# Patient Record
Sex: Male | Born: 1985 | Race: Black or African American | Hispanic: No | Marital: Single | Smoking: Former smoker
Health system: Southern US, Community
[De-identification: ages and names within clinical notes are randomized; demographics above are authoritative.]

## PROBLEM LIST (undated history)

## (undated) HISTORY — PX: NO PAST SURGERIES: SHX2092

---
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
## 2011-08-25 ENCOUNTER — Emergency Department (INDEPENDENT_AMBULATORY_CARE_PROVIDER_SITE_OTHER)
Admission: EM | Admit: 2011-08-25 | Discharge: 2011-08-25 | Disposition: A | Discharge status: Home / Self Care | Payer: Medicare Other | Source: Home / Self Care | Attending: Emergency Medicine | Admitting: Emergency Medicine

## 2011-08-25 ENCOUNTER — Encounter (HOSPITAL_COMMUNITY): Payer: Self-pay | Admitting: *Deleted

## 2011-08-25 DIAGNOSIS — R05 Cough: Secondary | ICD-10-CM

## 2011-08-25 MED ORDER — GI COCKTAIL ~~LOC~~
ORAL | Status: AC
  Filled 2011-08-25: qty 30

## 2011-08-25 MED ORDER — GI COCKTAIL ~~LOC~~
30.0000 mL | Freq: Once | ORAL | Status: AC
  Administered 2011-08-25: 30 mL via ORAL

## 2011-08-25 MED ORDER — FAMOTIDINE 20 MG PO TABS: 20.0000 mg | ORAL_TABLET | Freq: Every day | ORAL | Status: DC

## 2011-08-25 MED ORDER — PANTOPRAZOLE SODIUM 40 MG PO TBEC: 40.0000 mg | DELAYED_RELEASE_TABLET | Freq: Every day | ORAL | Status: DC

## 2011-08-25 NOTE — Discharge Instructions (Signed)
Try this first. Return or follow up with a doctor of your choice if you do not get better in a week with these medications. Return sooner if you have a fever >100.4, if you have trouble breathing, or any other concerns.   Go to www.goodrx.com to look up your medications. This will give you a list of where you can find your prescriptions at the most affordable prices.   RESOURCE GUIDE   Insufficient Money for Medicine Contact [employer]:  call "211" or [facility] [phone].  No Primary Care Doctor Call [facility]  [phone] Other agencies that provide inexpensive medical care    [facility]  [phone]    [facility]  [phone]    [facility]  [phone]    [facility]  [phone] [address] [place] [address] [facility] [phone]   [address], [place]z [address] [place], [place] [address]  [facility] Resources  [facility]     [employer]                          [facility]. [address] [place]                       [address]      [address] [place] [address]   [phone] (After Hours)

## 2011-08-25 NOTE — ED Notes (Signed)
Pt  Has  Symptoms  Of  Cough   /  Body  Aches  As well  As      Congested  And  Feeling  Weak  Pt  Reports  Symptoms   X  1  Week              Pt  Awake  As  Well  As  Alert       Speaking in complete  sentances        maske  And  In a  Private  Room

## 2011-08-25 NOTE — ED Provider Notes (Signed)
History     CSN: [id number]  Arrival date & time 08/25/11  1327   First MD Initiated Contact with Patient 08/25/11 1423      Chief Complaint  Patient presents with  . Cough    (Consider location/radiation/quality/duration/timing/severity/associated sxs/prior treatment) HPI Comments: Patient with nonproductive cough, irritated, itchy throat, waterbrash for the past week. Symptoms are worse at night when he lies down, and in the morning. Able to sleep through the night. No wheezing, chest pain, shortness of breath. No nausea, vomiting, fevers. No abdominal pain. Patient reports having a URI preceding his cough, with nasal congestion, rhinorrhea, postnasal drip, but that this has improved.  ROS as noted in HPI. All other ROS negative.   Patient is a 26 y.o. male presenting with cough. The history is provided by the patient. No language interpreter was used.  Cough This is a new problem. The current episode started more than 2 days ago. The problem has not changed since onset.The cough is non-productive. There has been no fever. Pertinent negatives include no shortness of breath.    History reviewed. No pertinent past medical history.  History reviewed. No pertinent past surgical history.  History reviewed. No pertinent family history.  History  Substance Use Topics  . Smoking status: Never Smoker   . Smokeless tobacco: Not on file  . Alcohol Use: No      Review of Systems  Respiratory: Positive for cough. Negative for shortness of breath.     Allergies  Review of patient's allergies indicates no known allergies.  Home Medications   Current Outpatient Rx  Name Route Sig Dispense Refill  . FAMOTIDINE 20 MG PO TABS Oral Take 1 tablet (20 mg total) by mouth daily. 20 tablet 0  . PANTOPRAZOLE SODIUM 40 MG PO TBEC Oral Take 1 tablet (40 mg total) by mouth daily. 20 tablet 0    BP 131/78  Pulse 100  Temp(Src) 99.3 F (37.4 C) (Oral)  Resp 18  SpO2 100%  Physical  Exam  Nursing note and vitals reviewed. Constitutional: He is oriented to person, place, and time. He appears well-developed and well-nourished.  HENT:  Head: Normocephalic and atraumatic.  Nose: Nose normal.  Mouth/Throat: Uvula is midline, oropharynx is clear and moist and mucous membranes are normal.  Eyes: Conjunctivae and EOM are normal.  Neck: Normal range of motion.  Cardiovascular: Normal rate, regular rhythm, normal heart sounds and intact distal pulses.   Pulmonary/Chest: Effort normal and breath sounds normal. No respiratory distress.  Abdominal: Soft. Bowel sounds are normal. He exhibits no distension.  Musculoskeletal: Normal range of motion.  Lymphadenopathy:    He has no cervical adenopathy.  Neurological: He is alert and oriented to person, place, and time.  Skin: Skin is warm and dry.  Psychiatric: He has a normal mood and affect. His behavior is normal.    ED Course  Procedures (including critical care time)  Labs Reviewed - No data to display No results found.   1. Cough       MDM  H&P suggestive of reflux. Lungs clear. No evidence of pneumonia, asthma at this time. Could also be a post viral cough, as patient does report having a upper respiratory infection preceding his cough. Gave GI cocktail, with significant improvement in symptoms. Will send him home with a PPI and H2 blocker. We'll refer him to local resources. If I since return if symptoms get worse, if he does not improve with current treatment, her other concerns.  Morrie Sheldon  Chaney Malling, MD 08/25/11 1815

## 2011-09-09 ENCOUNTER — Emergency Department (HOSPITAL_COMMUNITY)
Admission: EM | Admit: 2011-09-09 | Discharge: 2011-09-09 | Disposition: A | Discharge status: Home / Self Care | Payer: Medicare Other | Attending: Emergency Medicine | Admitting: Emergency Medicine

## 2011-09-09 ENCOUNTER — Encounter (HOSPITAL_COMMUNITY): Payer: Self-pay | Admitting: *Deleted

## 2011-09-09 ENCOUNTER — Emergency Department (HOSPITAL_COMMUNITY): Payer: Medicare Other

## 2011-09-09 DIAGNOSIS — K219 Gastro-esophageal reflux disease without esophagitis: Secondary | ICD-10-CM | POA: Insufficient documentation

## 2011-09-09 DIAGNOSIS — R05 Cough: Secondary | ICD-10-CM | POA: Insufficient documentation

## 2011-09-09 DIAGNOSIS — R059 Cough, unspecified: Secondary | ICD-10-CM | POA: Insufficient documentation

## 2011-09-09 DIAGNOSIS — R079 Chest pain, unspecified: Secondary | ICD-10-CM | POA: Insufficient documentation

## 2011-09-09 DIAGNOSIS — R110 Nausea: Secondary | ICD-10-CM | POA: Insufficient documentation

## 2011-09-09 DIAGNOSIS — R109 Unspecified abdominal pain: Secondary | ICD-10-CM | POA: Insufficient documentation

## 2011-09-09 LAB — CBC
Hemoglobin: 16 g/dL (ref 13.0–17.0)
MCHC: 34.7 g/dL (ref 30.0–36.0)
Platelets: 283 10*3/uL (ref 150–400)
RBC: 5.38 MIL/uL (ref 4.22–5.81)
RDW: 13.2 % (ref 11.5–15.5)

## 2011-09-09 LAB — POCT I-STAT, CHEM 8
BUN: 17 mg/dL (ref 6–23)
Calcium, Ion: 1.22 mmol/L (ref 1.12–1.32)
HCT: 48 % (ref 39.0–52.0)
Hemoglobin: 16.3 g/dL (ref 13.0–17.0)
Sodium: 142 mEq/L (ref 135–145)
TCO2: 25 mmol/L (ref 0–100)

## 2011-09-09 LAB — DIFFERENTIAL
Basophils Absolute: 0.1 10*3/uL (ref 0.0–0.1)
Basophils Relative: 1 % (ref 0–1)
Eosinophils Relative: 8 % — ABNORMAL HIGH (ref 0–5)
Lymphocytes Relative: 52 % — ABNORMAL HIGH (ref 12–46)
Monocytes Absolute: 0.6 10*3/uL (ref 0.1–1.0)
Neutro Abs: 2 10*3/uL (ref 1.7–7.7)

## 2011-09-09 MED ORDER — GI COCKTAIL ~~LOC~~
30.0000 mL | Freq: Once | ORAL | Status: AC
  Administered 2011-09-09: 30 mL via ORAL
  Filled 2011-09-09: qty 30

## 2011-09-09 MED ORDER — PANTOPRAZOLE SODIUM 40 MG IV SOLR
40.0000 mg | Freq: Once | INTRAVENOUS | Status: AC
  Administered 2011-09-09: 40 mg via INTRAVENOUS
  Filled 2011-09-09: qty 40

## 2011-09-09 MED ORDER — GUAIFENESIN ER 1200 MG PO TB12: 1.0000 | ORAL_TABLET | Freq: Two times a day (BID) | ORAL | Status: DC

## 2011-09-09 MED ORDER — ONDANSETRON 8 MG PO TBDP
8.0000 mg | ORAL_TABLET | Freq: Once | ORAL | Status: AC
  Administered 2011-09-09: 8 mg via ORAL
  Filled 2011-09-09: qty 1

## 2011-09-09 MED ORDER — PROMETHAZINE-DM 6.25-15 MG/5ML PO SYRP: 5.0000 mL | ORAL_SOLUTION | Freq: Four times a day (QID) | ORAL | Status: AC | PRN

## 2011-09-09 NOTE — ED Provider Notes (Signed)
History     CSN: [id number]  Arrival date & time 09/09/11  0343   First MD Initiated Contact with Patient 09/09/11 [phone]      Chief Complaint  Patient presents with  . Cough    (Consider location/radiation/quality/duration/timing/severity/associated sxs/prior treatment) HPI Comments: Patient has had cough, and chest burning for the past 3-4 weeks.  He was seen at [facility] Urgent care on March 8 and was given treatment for gastric reflux.  He started this medication with some relief, but it has come back, worse than before.  His coughing is worse when he lays back, but he does cough through today.  He does have a burning sensation that wakes him from sleep and is abdomen and upper chest to the point where makes him feel like he is choking on arrival to the emergency room.  He also developed a headache with cough.  Denies having fever, vomiting, constipation, myalgias  Patient is a 26 y.o. male presenting with cough. The history is provided by the patient.  Cough The current episode started more than 1 week ago. The problem occurs constantly. The problem has been gradually worsening. The cough is non-productive. There has been no fever. Associated symptoms include chest pain. Pertinent negatives include no chills, no rhinorrhea, no sore throat, no myalgias, no shortness of breath and no wheezing.    History reviewed. No pertinent past medical history.  History reviewed. No pertinent past surgical history.  History reviewed. No pertinent family history.  History  Substance Use Topics  . Smoking status: Former Games developer  . Smokeless tobacco: Never Used  . Alcohol Use: No      Review of Systems  Constitutional: Negative for fever and chills.  HENT: Negative for congestion, sore throat, rhinorrhea and trouble swallowing.   Respiratory: Positive for cough. Negative for shortness of breath and wheezing.   Cardiovascular: Positive for chest pain.  Gastrointestinal: Positive for nausea and  abdominal pain. Negative for vomiting and diarrhea.  Musculoskeletal: Negative for myalgias.  Neurological: Negative for dizziness and weakness.    Allergies  Review of patient's allergies indicates no known allergies.  Home Medications   Current Outpatient Rx  Name Route Sig Dispense Refill  . FAMOTIDINE 20 MG PO TABS Oral Take 1 tablet (20 mg total) by mouth daily. 20 tablet 0  . PANTOPRAZOLE SODIUM 40 MG PO TBEC Oral Take 1 tablet (40 mg total) by mouth daily. 20 tablet 0  . GUAIFENESIN ER 1200 MG PO TB12 Oral Take 1 tablet (1,200 mg total) by mouth 2 (two) times daily. 20 each 0  . PROMETHAZINE-DM 6.25-15 MG/5ML PO SYRP Oral Take 5 mLs by mouth 4 (four) times daily as needed for cough. 120 mL 0    BP 125/61  Pulse 89  Temp(Src) 98 F (36.7 C) (Oral)  Resp 16  SpO2 100%  Physical Exam  Constitutional: He is oriented to person, place, and time. He appears well-developed and well-nourished.  HENT:  Head: Normocephalic.  Eyes: Pupils are equal, round, and reactive to light.  Neck: Normal range of motion.  Cardiovascular: Normal rate.   Pulmonary/Chest: No respiratory distress. He has no wheezes. He has no rales. He exhibits no tenderness.  Abdominal: He exhibits no distension. There is no hepatosplenomegaly. There is tenderness. There is no rebound and no guarding. No hernia. Hernia confirmed negative in the ventral area.    Neurological: He is alert and oriented to person, place, and time.  Skin: Skin is warm and dry.  Psychiatric:  He has a normal mood and affect.    ED Course  Procedures (including critical care time)  Labs Reviewed  DIFFERENTIAL - Abnormal; Notable for the following:    Neutrophils Relative 31 (*)    Lymphocytes Relative 52 (*)    Eosinophils Relative 8 (*)    All other components within normal limits  POCT I-STAT, CHEM 8 - Abnormal; Notable for the following:    Glucose, Bld 102 (*)    All other components within normal limits  CBC  LAB  REPORT - SCANNED   Dg Chest 2 View  09/09/2011  *RADIOLOGY REPORT*  Clinical Data: Productive cough, fever, mid chest pain, shortness of breath  CHEST - 2 VIEW  Comparison: None  Findings: Upper-normal size of cardiac silhouette. Mediastinal contours and pulmonary vascularity normal. Bronchitic changes without infiltrate or effusion. No pneumothorax. No acute osseous findings.  IMPRESSION: Mild bronchitic changes.  Original Report Authenticated By: Lollie Marrow, M.D.     1. Cough   2. GERD (gastroesophageal reflux disease)       MDM  On patient's description of symptoms.  Gradual onset, worsening with lying down initially being helped with the PPI and then getting worse.  No history of asthma, fever, sore throat, pneumonia.  I have asked that IV be started patient be given PPI IV, as well as obtain chest x-ray, CBC and i-STAT to evaluate for hydration, and infection        Arman Filter, NP 09/10/11 2006

## 2011-09-09 NOTE — Discharge Instructions (Signed)
Increase your fluids. Return here as needed for any worsening in your condition.

## 2011-09-09 NOTE — ED Notes (Signed)
Bed:WA15<BR> Expected date:<BR> Expected time:<BR> Means of arrival:<BR> Comments:<BR> EMS

## 2011-09-09 NOTE — ED Notes (Signed)
Per EMS: Pt c/o cough and SOB for 2 weeks. Pt was seen at UC 2 weeks and was sent home with protonix and prevacid but pt has no relief. Pt has continuous semi-productive cough on arrival.

## 2011-09-10 NOTE — ED Provider Notes (Signed)
Medical screening examination/treatment/procedure(s) were performed by non-physician practitioner and as supervising physician I was immediately available for consultation/collaboration.   Lizvette Lightsey L Delford Wingert, MD 09/10/11 2239 

## 2012-01-03 ENCOUNTER — Emergency Department (HOSPITAL_COMMUNITY): Payer: Medicare Other

## 2012-01-03 ENCOUNTER — Encounter (HOSPITAL_COMMUNITY): Payer: Self-pay | Admitting: Emergency Medicine

## 2012-01-03 ENCOUNTER — Emergency Department (HOSPITAL_COMMUNITY)
Admission: EM | Admit: 2012-01-03 | Discharge: 2012-01-03 | Disposition: A | Discharge status: Home / Self Care | Payer: Medicare Other | Attending: Emergency Medicine | Admitting: Emergency Medicine

## 2012-01-03 DIAGNOSIS — Z87891 Personal history of nicotine dependence: Secondary | ICD-10-CM | POA: Insufficient documentation

## 2012-01-03 DIAGNOSIS — R091 Pleurisy: Secondary | ICD-10-CM | POA: Insufficient documentation

## 2012-01-03 LAB — URINALYSIS, ROUTINE W REFLEX MICROSCOPIC
Ketones, ur: NEGATIVE mg/dL
Leukocytes, UA: NEGATIVE
Nitrite: NEGATIVE
Protein, ur: NEGATIVE mg/dL
Urobilinogen, UA: 1 mg/dL (ref 0.0–1.0)

## 2012-01-03 MED ORDER — IBUPROFEN 400 MG PO TABS
600.0000 mg | ORAL_TABLET | Freq: Once | ORAL | Status: AC
  Administered 2012-01-03: 600 mg via ORAL
  Filled 2012-01-03: qty 1

## 2012-01-03 MED ORDER — IBUPROFEN 600 MG PO TABS: 600.0000 mg | ORAL_TABLET | Freq: Four times a day (QID) | ORAL | Status: AC | PRN

## 2012-01-03 MED ORDER — HYDROCODONE-ACETAMINOPHEN 5-325 MG PO TABS: 2.0000 | ORAL_TABLET | ORAL | Status: AC | PRN

## 2012-01-03 MED ORDER — HYDROCODONE-ACETAMINOPHEN 5-325 MG PO TABS
1.0000 | ORAL_TABLET | Freq: Once | ORAL | Status: AC
  Administered 2012-01-03: 1 via ORAL
  Filled 2012-01-03: qty 1

## 2012-01-03 NOTE — ED Notes (Signed)
Pt c/o right flank and rib pain x 2 days; pt denies UTI sx of injury; pt sts worse with deep breath

## 2012-01-03 NOTE — ED Provider Notes (Signed)
History   This chart was scribed for Nelia Shi, MD by Sofie Rower. The patient was seen in room TR05C/TR05C and the patient's care was started at 1:21 PM     CSN: [id number]  Arrival date & time 01/03/12  1241   First MD Initiated Contact with Patient 01/03/12 1320      Chief Complaint  Patient presents with  . Flank Pain    (Consider location/radiation/quality/duration/timing/severity/associated sxs/prior treatment) Patient is a 26 y.o. male presenting with flank pain. The history is provided by the patient. No language interpreter was used.  Flank Pain This is a new problem. The current episode started 2 days ago. The problem occurs constantly. The problem has not changed since onset.Pertinent negatives include no abdominal pain and no shortness of breath. The symptoms are aggravated by twisting and bending. Nothing relieves the symptoms.    Marcus Woods is a 26 y.o. male who presents to the Emergency Department complaining of moderate, episodic flank pain located at the right flank onset two days ago with associated symptoms of cough. The pt informs the EDP that he has been sleeping on an old mattress. Modifying factors include deep breathing which intensifies the pain, standing up/stretching/certain positions which intensify the pain.  Pt denies performing any heavy lifting, dysuria, hematuria, swelling in the legs.    History reviewed. No pertinent past medical history.  History reviewed. No pertinent past surgical history.  History reviewed. No pertinent family history.  History  Substance Use Topics  . Smoking status: Former Games developer  . Smokeless tobacco: Never Used  . Alcohol Use: No      Review of Systems  Respiratory: Negative for shortness of breath.   Gastrointestinal: Negative for abdominal pain.  Genitourinary: Positive for flank pain.  All other systems reviewed and are negative.    10 Systems reviewed and all are negative for acute change except as  noted in the HPI.    Allergies  Review of patient's allergies indicates no known allergies.  Home Medications   Current Outpatient Rx  Name Route Sig Dispense Refill  . HYDROCODONE-ACETAMINOPHEN 5-325 MG PO TABS Oral Take 2 tablets by mouth every 4 (four) hours as needed for pain. 10 tablet 0  . IBUPROFEN 600 MG PO TABS Oral Take 1 tablet (600 mg total) by mouth every 6 (six) hours as needed for pain. 30 tablet 0    BP 115/99  Pulse 74  Temp 98.5 F (36.9 C) (Oral)  Resp 18  SpO2 97%  Physical Exam  Nursing note and vitals reviewed. Constitutional: He is oriented to person, place, and time. He appears well-developed and well-nourished. No distress.  HENT:  Head: Normocephalic and atraumatic.  Nose: Nose normal.  Eyes: Pupils are equal, round, and reactive to light.  Neck: Normal range of motion.  Cardiovascular: Normal rate and intact distal pulses.   Pulmonary/Chest: No respiratory distress.         Area marks pleuritic chest pain  Abdominal: Normal appearance. He exhibits no distension.  Musculoskeletal: Normal range of motion.  Neurological: He is alert and oriented to person, place, and time. No cranial nerve deficit.  Skin: Skin is warm and dry. No rash noted.  Psychiatric: He has a normal mood and affect. His behavior is normal.   PERC (-)   ED Course  Procedures (including critical care time)  DIAGNOSTIC STUDIES: Oxygen Saturation is 97% on room air, normal by my interpretation.    COORDINATION OF CARE:     1:23PM-  EDP at bedside discusses treatment plan concerning urine sample and chest x-ray.   Results for orders placed during the hospital encounter of 01/03/12  URINALYSIS, ROUTINE W REFLEX MICROSCOPIC      Component Value Range   Color, Urine YELLOW  YELLOW   APPearance CLEAR  CLEAR   Specific Gravity, Urine 1.023  1.005 - 1.030   pH 6.0  5.0 - 8.0   Glucose, UA NEGATIVE  NEGATIVE mg/dL   Hgb urine dipstick NEGATIVE  NEGATIVE   Bilirubin  Urine NEGATIVE  NEGATIVE   Ketones, ur NEGATIVE  NEGATIVE mg/dL   Protein, ur NEGATIVE  NEGATIVE mg/dL   Urobilinogen, UA 1.0  0.0 - 1.0 mg/dL   Nitrite NEGATIVE  NEGATIVE   Leukocytes, UA NEGATIVE  NEGATIVE   Dg Chest 2 View  01/03/2012  *RADIOLOGY REPORT*  Clinical Data: 26 year old male with left chest pain extending to the side and maximal with deep inspiration.  No known injury.  CHEST - 2 VIEW  Comparison: 09/09/2011.  Findings: Normal lung volumes. Normal cardiac size and mediastinal contours.  Visualized tracheal air column is within normal limits. Lung parenchyma within normal limits.  No pneumothorax or effusion. The lungs are clear. No acute osseous abnormality identified.  IMPRESSION: Negative, no acute cardiopulmonary abnormality.  Original Report Authenticated By: Harley Hallmark, M.D.         1. Pleurisy       MDM        I personally performed the services described in this documentation, which was scribed in my presence. The recorded information has been reviewed and considered.     Nelia Shi, MD 01/03/12 [phone]

## 2012-02-07 ENCOUNTER — Ambulatory Visit: Payer: Medicaid Other | Admitting: Family Medicine

## 2020-08-17 ENCOUNTER — Ambulatory Visit
Admission: EM | Admit: 2020-08-17 | Discharge: 2020-08-17 | Disposition: A | Discharge status: Home / Self Care | Payer: Medicare Other | Attending: Family Medicine | Admitting: Family Medicine

## 2020-08-17 ENCOUNTER — Encounter: Payer: Self-pay | Admitting: Emergency Medicine

## 2020-08-17 ENCOUNTER — Other Ambulatory Visit: Payer: Self-pay

## 2020-08-17 DIAGNOSIS — Z20822 Contact with and (suspected) exposure to covid-19: Secondary | ICD-10-CM | POA: Insufficient documentation

## 2020-08-17 DIAGNOSIS — Z87891 Personal history of nicotine dependence: Secondary | ICD-10-CM | POA: Insufficient documentation

## 2020-08-17 DIAGNOSIS — R0789 Other chest pain: Secondary | ICD-10-CM | POA: Insufficient documentation

## 2020-08-17 MED ORDER — MELOXICAM 15 MG PO TABS: 15.0000 mg | ORAL_TABLET | Freq: Every day | ORAL | 0 refills | Status: DC | PRN

## 2020-08-17 NOTE — ED Provider Notes (Signed)
[facility]    CSN: [id number] Arrival date & time: 08/17/20  1609      History   Chief Complaint Chief Complaint  Patient presents with  . Chest Pain  . Shortness of Breath   HPI 35 year old male presents the above complaints.  Started yesterday.  Patient reports diffuse chest pain and associated shortness of breath.  Occurs primarily with certain activities and movements.  Also occurs with sneezing.  He states it is worse when he bends over and reaches for an object.  He feels short of breath.  He describes the pain as sharp, 7/10 in severity.  Better at rest and with no activity.  No medications or interventions tried.  No other complaints.  Home Medications    Prior to Admission medications   Medication Sig Start Date End Date Taking? Authorizing Provider  meloxicam (MOBIC) 15 MG tablet Take 1 tablet (15 mg total) by mouth daily as needed for pain. 08/17/20  Yes Tommie Sams, DO   Social History Social History   Tobacco Use  . Smoking status: Former Games developer  . Smokeless tobacco: Never Used  Vaping Use  . Vaping Use: Never used  Substance Use Topics  . Alcohol use: No  . Drug use: No     Allergies   Patient has no known allergies.   Review of Systems Review of Systems Per HPI  Physical Exam Triage Vital Signs ED Triage Vitals  Enc Vitals Group     BP 08/17/20 1628 (!) 148/95     Pulse Rate 08/17/20 1628 76     Resp 08/17/20 1628 20     Temp 08/17/20 1628 98.1 F (36.7 C)     Temp Source 08/17/20 1628 Oral     SpO2 08/17/20 1628 99 %     Weight 08/17/20 1624 215 lb (97.5 kg)     Height 08/17/20 1624 5\' 9"  (1.753 m)     Head Circumference --      Peak Flow --      Pain Score 08/17/20 1624 7     Pain Loc --      Pain Edu? --      Excl. in GC? --    Updated Vital Signs BP (!) 148/95 (BP Location: Right Arm)   Pulse 76   Temp 98.1 F (36.7 C) (Oral)   Resp 20   Ht 5\' 9"  (1.753 m)   Wt 97.5 kg   SpO2 99%   BMI 31.75 kg/m   Visual  Acuity Right Eye Distance:   Left Eye Distance:   Bilateral Distance:    Right Eye Near:   Left Eye Near:    Bilateral Near:     Physical Exam Vitals and nursing note reviewed.  Constitutional:      General: He is not in acute distress.    Appearance: Normal appearance. He is not ill-appearing.  HENT:     Head: Normocephalic and atraumatic.  Eyes:     General:        Right eye: No discharge.        Left eye: No discharge.     Conjunctiva/sclera: Conjunctivae normal.  Cardiovascular:     Rate and Rhythm: Normal rate and regular rhythm.  Pulmonary:     Effort: Pulmonary effort is normal.     Breath sounds: Normal breath sounds. No wheezing or rales.  Chest:     Chest wall: No tenderness.  Neurological:     Mental Status: He is  alert.  Psychiatric:        Mood and Affect: Mood normal.        Behavior: Behavior normal.    UC Treatments / Results  Labs (all labs ordered are listed, but only abnormal results are displayed) Labs Reviewed  SARS CORONAVIRUS 2 (TAT 6-24 HRS)    EKG Interpretation: Normal sinus rhythm at the rate of 66.  Probable LAE.  Early repolarization.  Radiology No results found.  Procedures Procedures (including critical care time)  Medications Ordered in UC Medications - No data to display  Initial Impression / Assessment and Plan / UC Course  I have reviewed the triage vital signs and the nursing notes.  Pertinent labs & imaging results that were available during my care of the patient were reviewed by me and considered in my medical decision making (see chart for details).    35 year old male presents with chest wall pain and back pain.  Treating with meloxicam.  Supportive care.  Final Clinical Impressions(s) / UC Diagnoses   Final diagnoses:  Chest wall pain     Discharge Instructions     Rest.  Medication as prescribed.  If worsens, go to the hospital.  Take care  Dr. Adriana Simas    ED Prescriptions    Medication Sig  Dispense Auth. Provider   meloxicam (MOBIC) 15 MG tablet Take 1 tablet (15 mg total) by mouth daily as needed for pain. 30 tablet Tommie Sams, DO     PDMP not reviewed this encounter.   Tommie Sams, [place] 08/17/20 1733

## 2020-08-17 NOTE — ED Triage Notes (Signed)
Pt c/o chest pain, shortness of breath. He states pain is when he moves, sneezes. Started yesterday. He states when he bends over he can not breathe and he has pain.

## 2020-08-17 NOTE — Discharge Instructions (Signed)
Rest.  Medication as prescribed.  If worsens, go to the hospital.  Take care  Dr. Adriana Simas

## 2020-08-18 LAB — SARS CORONAVIRUS 2 (TAT 6-24 HRS): SARS Coronavirus 2: NEGATIVE

## 2020-09-19 ENCOUNTER — Ambulatory Visit
Admission: EM | Admit: 2020-09-19 | Discharge: 2020-09-19 | Disposition: A | Discharge status: Home / Self Care | Payer: Medicare Other | Attending: Physician Assistant | Admitting: Physician Assistant

## 2020-09-19 ENCOUNTER — Encounter: Payer: Self-pay | Admitting: Emergency Medicine

## 2020-09-19 ENCOUNTER — Other Ambulatory Visit: Payer: Self-pay

## 2020-09-19 DIAGNOSIS — R0981 Nasal congestion: Secondary | ICD-10-CM | POA: Diagnosis not present

## 2020-09-19 DIAGNOSIS — Z20822 Contact with and (suspected) exposure to covid-19: Secondary | ICD-10-CM | POA: Insufficient documentation

## 2020-09-19 DIAGNOSIS — J069 Acute upper respiratory infection, unspecified: Secondary | ICD-10-CM | POA: Insufficient documentation

## 2020-09-19 DIAGNOSIS — Z87891 Personal history of nicotine dependence: Secondary | ICD-10-CM | POA: Insufficient documentation

## 2020-09-19 DIAGNOSIS — R059 Cough, unspecified: Secondary | ICD-10-CM | POA: Insufficient documentation

## 2020-09-19 MED ORDER — PSEUDOEPH-BROMPHEN-DM 30-2-10 MG/5ML PO SYRP: 10.0000 mL | ORAL_SOLUTION | Freq: Four times a day (QID) | ORAL | 0 refills | Status: AC | PRN

## 2020-09-19 MED ORDER — IPRATROPIUM BROMIDE 0.06 % NA SOLN: 2.0000 | Freq: Four times a day (QID) | NASAL | 12 refills | Status: DC

## 2020-09-19 NOTE — ED Provider Notes (Signed)
[facility]    CSN: [id number] Arrival date & time: 09/19/20  1116      History   Chief Complaint Chief Complaint  Patient presents with  . Nasal Congestion    HPI Marcus Woods is a 35 y.o. male presenting for 3 day history of runny nose/nasal congestion, cough and sneezing. He says that yesterday noticed a reduced sense of smell and taste.  He denies any fevers, fatigue, body aches, sore there, headaches, chest pain, breathing difficulty, abdominal pain, nausea/vomiting or diarrhea.  No sick contacts and no known exposure to COVID-19.  Fully vaccinated for COVID-19.  He has taken over-the-counter DayQuil and NyQuil for symptoms.  He does not report any other complaints or concerns.  HPI  History reviewed. No pertinent past medical history.  There are no problems to display for this patient.   Past Surgical History:  Procedure Laterality Date  . NO PAST SURGERIES         Home Medications    Prior to Admission medications   Medication Sig Start Date End Date Taking? Authorizing Provider  brompheniramine-pseudoephedrine-DM 30-2-10 MG/5ML syrup Take 10 mLs by mouth 4 (four) times daily as needed for up to 7 days. 09/19/20 09/26/20 Yes Eusebio Friendly B, PA-C  ipratropium (ATROVENT) 0.06 % nasal spray Place 2 sprays into both nostrils 4 (four) times daily. 09/19/20  Yes Shirlee Latch, PA-C  meloxicam (MOBIC) 15 MG tablet Take 1 tablet (15 mg total) by mouth daily as needed for pain. 08/17/20   Tommie Sams, DO    Family History History reviewed. No pertinent family history.  Social History Social History   Tobacco Use  . Smoking status: Former Games developer  . Smokeless tobacco: Never Used  Vaping Use  . Vaping Use: Never used  Substance Use Topics  . Alcohol use: No  . Drug use: No     Allergies   Patient has no known allergies.   Review of Systems Review of Systems  Constitutional: Negative for fatigue and fever.  HENT: Positive for congestion, rhinorrhea  and sneezing. Negative for sinus pressure, sinus pain and sore throat.   Respiratory: Positive for cough. Negative for shortness of breath.   Gastrointestinal: Negative for abdominal pain, diarrhea, nausea and vomiting.  Musculoskeletal: Negative for myalgias.  Neurological: Negative for weakness, light-headedness and headaches.  Hematological: Negative for adenopathy.     Physical Exam Triage Vital Signs ED Triage Vitals  Enc Vitals Group     BP 09/19/20 1139 (!) 123/91     Pulse Rate 09/19/20 1139 69     Resp 09/19/20 1139 16     Temp 09/19/20 1139 97.9 F (36.6 C)     Temp Source 09/19/20 1139 Oral     SpO2 09/19/20 1139 100 %     Weight 09/19/20 1136 215 lb (97.5 kg)     Height 09/19/20 1136 5\' 11"  (1.803 m)     Head Circumference --      Peak Flow --      Pain Score 09/19/20 1136 0     Pain Loc --      Pain Edu? --      Excl. in GC? --    No data found.  Updated Vital Signs BP (!) 123/91 (BP Location: Left Arm)   Pulse 69   Temp 97.9 F (36.6 C) (Oral)   Resp 16   Ht 5\' 11"  (1.803 m)   Wt 215 lb (97.5 kg)   SpO2 100%  BMI 29.99 kg/m       Physical Exam Vitals and nursing note reviewed.  Constitutional:      General: He is not in acute distress.    Appearance: Normal appearance. He is well-developed and normal weight. He is not ill-appearing.  HENT:     Head: Normocephalic and atraumatic.     Nose: Congestion and rhinorrhea present.     Mouth/Throat:     Mouth: Mucous membranes are moist.     Pharynx: Oropharynx is clear.  Eyes:     General: No scleral icterus.    Conjunctiva/sclera: Conjunctivae normal.  Cardiovascular:     Rate and Rhythm: Normal rate and regular rhythm.     Heart sounds: Normal heart sounds.  Pulmonary:     Effort: Pulmonary effort is normal. No respiratory distress.     Breath sounds: Normal breath sounds.  Musculoskeletal:     Cervical back: Neck supple.  Skin:    General: Skin is warm and dry.  Neurological:      General: No focal deficit present.     Mental Status: He is alert. Mental status is at baseline.     Motor: No weakness.     Gait: Gait normal.  Psychiatric:        Mood and Affect: Mood normal.        Behavior: Behavior normal.        Thought Content: Thought content normal.      UC Treatments / Results  Labs (all labs ordered are listed, but only abnormal results are displayed) Labs Reviewed  SARS CORONAVIRUS 2 (TAT 6-24 HRS)    EKG   Radiology No results found.  Procedures Procedures (including critical care time)  Medications Ordered in UC Medications - No data to display  Initial Impression / Assessment and Plan / UC Course  I have reviewed the triage vital signs and the nursing notes.  Pertinent labs & imaging results that were available during my care of the patient were reviewed by me and considered in my medical decision making (see chart for details).   35 year old male presenting for cough, congestion and sneezing as well as reduced sense of smell and taste.  Symptoms ongoing x3 days.  Vital signs are all stable.  He is afebrile.  He is overall well-appearing and does have a lot of nasal congestion and rhinorrhea on exam.  His chest is clear auscultation heart regular rate and rhythm.  Covid test obtained.  Current CDC guidelines, isolation protocol and ED precautions reviewed with patient.  Advised supportive care with increasing rest and fluids.  I have sent Bromfed-DM and Atrovent nasal spray.  Encouraged him to follow back up with us if he develops any worsening symptoms or not better and the next week.  Work note given.   Final Clinical Impressions(s) / UC Diagnoses   Final diagnoses:  Viral upper respiratory tract infection  Cough  Nasal congestion     Discharge Instructions     URI/COLD SYMPTOMS: Your exam today is consistent with a viral illness. Antibiotics are not indicated at this time. Use medications as directed, including cough syrup,  nasal saline, and decongestants. Your symptoms should improve over the next few days and resolve within 7-10 days. Increase rest and fluids. F/u if symptoms worsen or predominate such as sore throat, ear pain, productive cough, shortness of breath, or if you develop high fevers or worsening fatigue over the next several days.    You have received COVID testing today  either for positive exposure, concerning symptoms that could be related to COVID infection, screening purposes, or re-testing after confirmed positive.  Your test obtained today checks for active viral infection in the last 1-2 weeks. If your test is negative now, you can still test positive later. So, if you do develop symptoms you should either get re-tested and/or isolate x 5 days and then strict mask use x 5 days (unvaccinated) or mask use x 10 days (vaccinated). Please follow CDC guidelines.  While Rapid antigen tests come back in 15-20 minutes, send out PCR/molecular test results typically come back within 1-3 days. In the mean time, if you are symptomatic, assume this could be a positive test and treat/monitor yourself as if you do have COVID.   We will call with test results if positive. Please download the MyChart app and set up a profile to access test results.   If symptomatic, go home and rest. Push fluids. Take Tylenol as needed for discomfort. Gargle warm salt water. Throat lozenges. Take Mucinex DM or Robitussin for cough. Humidifier in bedroom to ease coughing. Warm showers. Also review the COVID handout for more information.  COVID-19 INFECTION: The incubation period of COVID-19 is approximately 14 days after exposure, with most symptoms developing in roughly 4-5 days. Symptoms may range in severity from mild to critically severe. Roughly 80% of those infected will have mild symptoms. People of any age may become infected with COVID-19 and have the ability to transmit the virus. The most common symptoms include: fever,  fatigue, cough, body aches, headaches, sore throat, nasal congestion, shortness of breath, nausea, vomiting, diarrhea, changes in smell and/or taste.    COURSE OF ILLNESS Some patients may begin with mild disease which can progress quickly into critical symptoms. If your symptoms are worsening please call ahead to the Emergency Department and proceed there for further treatment. Recovery time appears to be roughly 1-2 weeks for mild symptoms and 3-6 weeks for severe disease.   GO IMMEDIATELY TO ER FOR FEVER YOU ARE UNABLE TO GET DOWN WITH TYLENOL, BREATHING PROBLEMS, CHEST PAIN, FATIGUE, LETHARGY, INABILITY TO EAT OR DRINK, ETC  QUARANTINE AND ISOLATION: To help decrease the spread of COVID-19 please remain isolated if you have COVID infection or are highly suspected to have COVID infection. This means -stay home and isolate to one room in the home if you live with others. Do not share a bed or bathroom with others while ill, sanitize and wipe down all countertops and keep common areas clean and disinfected. Stay home for 5 days. If you have no symptoms or your symptoms are resolving after 5 days, you can leave your house. Continue to wear a mask around others for 5 additional days. If you have been in close contact (within 6 feet) of someone diagnosed with COVID 19, you are advised to quarantine in your home for 14 days as symptoms can develop anywhere from 2-14 days after exposure to the virus. If you develop symptoms, you  must isolate.  Most current guidelines for COVID after exposure -unvaccinated: isolate 5 days and strict mask use x 5 days. Test on day 5 is possible -vaccinated: wear mask x 10 days if symptoms do not develop -You do not necessarily need to be tested for COVID if you have + exposure and  develop symptoms. Just isolate at home x10 days from symptom onset During this global pandemic, CDC advises to practice social distancing, try to stay at least 56ft away from others at all  times.  Wear a face covering. Wash and sanitize your hands regularly and avoid going anywhere that is not necessary.  KEEP IN MIND THAT THE COVID TEST IS NOT 100% ACCURATE AND YOU SHOULD STILL DO EVERYTHING TO PREVENT POTENTIAL SPREAD OF VIRUS TO OTHERS (WEAR MASK, WEAR GLOVES, WASH HANDS AND SANITIZE REGULARLY). IF INITIAL TEST IS NEGATIVE, THIS MAY NOT MEAN YOU ARE DEFINITELY NEGATIVE. MOST ACCURATE TESTING IS DONE 5-7 DAYS AFTER EXPOSURE.   It is not advised by CDC to get re-tested after receiving a positive COVID test since you can still test positive for weeks to months after you have already cleared the virus.   *If you have not been vaccinated for COVID, I strongly suggest you consider getting vaccinated as long as there are no contraindications.      ED Prescriptions    Medication Sig Dispense Auth. Provider   brompheniramine-pseudoephedrine-DM 30-2-10 MG/5ML syrup Take 10 mLs by mouth 4 (four) times daily as needed for up to 7 days. 150 mL Eusebio Friendly B, PA-C   ipratropium (ATROVENT) 0.06 % nasal spray Place 2 sprays into both nostrils 4 (four) times daily. 15 mL Shirlee Latch, PA-C     PDMP not reviewed this encounter.   Shirlee Latch, PA-C 09/19/20 1212

## 2020-09-19 NOTE — Discharge Instructions (Signed)

## 2020-09-19 NOTE — ED Triage Notes (Signed)
Patient c/o runny nose, cough, and sneezing that started 3 days ago.  Patient states that he loss his taste yesterday.

## 2020-09-20 LAB — SARS CORONAVIRUS 2 (TAT 6-24 HRS): SARS Coronavirus 2: NEGATIVE

## 2021-03-10 ENCOUNTER — Encounter: Payer: Self-pay | Admitting: Emergency Medicine

## 2021-03-10 ENCOUNTER — Other Ambulatory Visit: Payer: Self-pay

## 2021-03-10 ENCOUNTER — Emergency Department
Admission: EM | Admit: 2021-03-10 | Discharge: 2021-03-10 | Disposition: A | Discharge status: Home / Self Care | Payer: Medicare Other | Attending: Emergency Medicine | Admitting: Emergency Medicine

## 2021-03-10 ENCOUNTER — Emergency Department: Payer: Medicare Other

## 2021-03-10 DIAGNOSIS — R0602 Shortness of breath: Secondary | ICD-10-CM | POA: Diagnosis not present

## 2021-03-10 DIAGNOSIS — Z87891 Personal history of nicotine dependence: Secondary | ICD-10-CM | POA: Insufficient documentation

## 2021-03-10 DIAGNOSIS — R002 Palpitations: Secondary | ICD-10-CM | POA: Insufficient documentation

## 2021-03-10 LAB — CBC
HCT: 47.4 % (ref 39.0–52.0)
Hemoglobin: 16.3 g/dL (ref 13.0–17.0)
MCH: 29.6 pg (ref 26.0–34.0)
MCHC: 34.4 g/dL (ref 30.0–36.0)
MCV: 86 fL (ref 80.0–100.0)
Platelets: 279 10*3/uL (ref 150–400)
RBC: 5.51 MIL/uL (ref 4.22–5.81)
RDW: 13 % (ref 11.5–15.5)
WBC: 5.9 10*3/uL (ref 4.0–10.5)
nRBC: 0 % (ref 0.0–0.2)

## 2021-03-10 LAB — BASIC METABOLIC PANEL
Anion gap: 10 (ref 5–15)
BUN: 21 mg/dL — ABNORMAL HIGH (ref 6–20)
CO2: 26 mmol/L (ref 22–32)
Calcium: 9.6 mg/dL (ref 8.9–10.3)
Chloride: 103 mmol/L (ref 98–111)
Creatinine, Ser: 1.17 mg/dL (ref 0.61–1.24)
GFR, Estimated: >60 mL/min (ref >60)
Glucose, Bld: 101 mg/dL — ABNORMAL HIGH (ref 70–99)
Potassium: 3.9 mmol/L (ref 3.5–5.1)
Sodium: 139 mmol/L (ref 135–145)

## 2021-03-10 LAB — TROPONIN I (HIGH SENSITIVITY): Troponin I (High Sensitivity): 3 ng/L (ref <18)

## 2021-03-10 NOTE — ED Triage Notes (Signed)
Pt to triage via w/c with no distress noted; pt reports lying in bed and felt "heart slowing down and couldn't breathe", got OOB and it eventually subsided; st similar episode and eval at [facility] with no negative findings; pt denies c/o at present

## 2021-03-10 NOTE — ED Notes (Signed)
See triage note.  Presents with some SOB and "my heart was slowing down"  states this started this am   no fever  resp even and non labored at present

## 2021-03-10 NOTE — ED Provider Notes (Signed)
[facility] Emergency Department Provider Note  Time seen: 7:48 AM  I have reviewed the triage vital signs and the nursing notes.   HISTORY  Chief Complaint Shortness of Breath   HPI Marcus Woods is a 35 y.o. male with no past medical history who presents to the emergency department for palpitations.  Patient states approximately week ago he was awoken overnight with palpitations feeling his heart skipping around.  He states again last night he woke up around 4:00 this morning with palpitations feeling like his heart was skipping around.  Patient denies any chest pain.  States it was making him feel somewhat short of breath.  No nausea or diaphoresis.  Denies any chest pain currently.  No abdominal pain.  Patient does not feel the palpitations at this time.  History reviewed. No pertinent past medical history.  There are no problems to display for this patient.   Past Surgical History:  Procedure Laterality Date   NO PAST SURGERIES      Prior to Admission medications   Medication Sig Start Date End Date Taking? Authorizing Provider  ipratropium (ATROVENT) 0.06 % nasal spray Place 2 sprays into both nostrils 4 (four) times daily. 09/19/20   Shirlee Latch, PA-C  meloxicam (MOBIC) 15 MG tablet Take 1 tablet (15 mg total) by mouth daily as needed for pain. 08/17/20   Tommie Sams, DO    No Known Allergies  No family history on file.  Social History Social History   Tobacco Use   Smoking status: Former   Smokeless tobacco: Never  Building services engineer Use: Never used  Substance Use Topics   Alcohol use: No   Drug use: No    Review of Systems Constitutional: Negative for fever. Cardiovascular: Negative for chest pain.  Positive for palpitations, now resolved Respiratory: Mild shortness of breath during the palpitations but none since. Gastrointestinal: Negative for abdominal pain, vomiting and diarrhea. Musculoskeletal: Negative for musculoskeletal  complaints Skin: Negative for skin complaints  Neurological: Negative for headache All other ROS negative  ____________________________________________   PHYSICAL EXAM:  VITAL SIGNS: ED Triage Vitals  Enc Vitals Group     BP 03/10/21 0526 (!) 143/108     Pulse Rate 03/10/21 0526 88     Resp 03/10/21 0526 18     Temp 03/10/21 0526 98 F (36.7 C)     Temp Source 03/10/21 0526 Oral     SpO2 03/10/21 0526 100 %     Weight 03/10/21 0528 215 lb (97.5 kg)     Height 03/10/21 0528 5\' 10"  (1.778 m)     Head Circumference --      Peak Flow --      Pain Score 03/10/21 0528 0     Pain Loc --      Pain Edu? --      Excl. in GC? --    Constitutional: Alert and oriented. Well appearing and in no distress. Eyes: Normal exam ENT      Head: Normocephalic and atraumatic.      Mouth/Throat: Mucous membranes are moist. Cardiovascular: Normal rate, regular rhythm. No murmur Respiratory: Normal respiratory effort without tachypnea nor retractions. Breath sounds are clear  Gastrointestinal: Soft and nontender. No distention.   Musculoskeletal: Nontender with normal range of motion in all extremities. Neurologic:  Normal speech and language. No gross focal neurologic deficits  Skin:  Skin is warm, dry and intact.  Psychiatric: Mood and affect are normal.   ____________________________________________  EKG  EKG viewed and interpreted by myself shows a normal sinus rhythm at 77 bpm with a narrow QRS, normal axis, normal intervals, no concerning ST changes.  ____________________________________________    RADIOLOGY  Chest x-ray is negative  ____________________________________________   INITIAL IMPRESSION / ASSESSMENT AND PLAN / ED COURSE  Pertinent labs & imaging results that were available during my care of the patient were reviewed by me and considered in my medical decision making (see chart for details).   Patient presents emergency department for palpitations.  Overall the  patient appears well.  Slightly hypertensive otherwise reassuring vitals.  Reassuring lab work including a negative troponin.  Chest x-ray is clear.  EKG reassuring.  I discussed reducing alcohol, increasing sleep and reducing stimulants such as caffeine.  I also discussed cardiology follow-up for consideration of a Holter monitor.  Patient agreeable to plan of care.  Discussed my typical return precautions.  Marcus Woods was evaluated in Emergency Department on 03/10/2021 for the symptoms described in the history of present illness. He was evaluated in the context of the global COVID-19 pandemic, which necessitated consideration that the patient might be at risk for infection with the SARS-CoV-2 virus that causes COVID-19. Institutional protocols and algorithms that pertain to the evaluation of patients at risk for COVID-19 are in a state of rapid change based on information released by regulatory bodies including the CDC and federal and state organizations. These policies and algorithms were followed during the patient's care in the ED.  ____________________________________________   FINAL CLINICAL IMPRESSION(S) / ED DIAGNOSES  Palpitations.   Minna Antis, MD 03/10/21 [phone]

## 2021-05-06 ENCOUNTER — Ambulatory Visit
Admission: EM | Admit: 2021-05-06 | Discharge: 2021-05-06 | Disposition: A | Discharge status: Home / Self Care | Payer: Medicare Other | Attending: Emergency Medicine | Admitting: Emergency Medicine

## 2021-05-06 ENCOUNTER — Other Ambulatory Visit: Payer: Self-pay

## 2021-05-06 DIAGNOSIS — J069 Acute upper respiratory infection, unspecified: Secondary | ICD-10-CM | POA: Insufficient documentation

## 2021-05-06 LAB — RAPID INFLUENZA A&B ANTIGENS
Influenza A (ARMC): NEGATIVE
Influenza B (ARMC): NEGATIVE

## 2021-05-06 MED ORDER — BENZONATATE 100 MG PO CAPS: 200.0000 mg | ORAL_CAPSULE | Freq: Three times a day (TID) | ORAL | 0 refills | Status: DC

## 2021-05-06 MED ORDER — IPRATROPIUM BROMIDE 0.06 % NA SOLN: 2.0000 | Freq: Four times a day (QID) | NASAL | 12 refills | Status: DC

## 2021-05-06 MED ORDER — PROMETHAZINE-DM 6.25-15 MG/5ML PO SYRP: 5.0000 mL | ORAL_SOLUTION | Freq: Four times a day (QID) | ORAL | 0 refills | Status: DC | PRN

## 2021-05-06 MED ORDER — ACETAMINOPHEN 500 MG PO TABS
1000.0000 mg | ORAL_TABLET | Freq: Once | ORAL | Status: AC
  Administered 2021-05-06: 1000 mg via ORAL

## 2021-05-06 NOTE — ED Triage Notes (Signed)
Pt here with C/O flu like SX, chills, dry cough, headache, body aches since last night.

## 2021-05-06 NOTE — ED Provider Notes (Signed)
[facility]    CSN: [id number] Arrival date & time: 05/06/21  1722      History   Chief Complaint Chief Complaint  Patient presents with   Generalized Body Aches    HPI Marcus Woods is a 35 y.o. male.   HPI  35 year old male here for evaluation of flulike symptoms.  Patient reports that he has been experiencing runny nose and nasal congestion, sore throat, chills, nonproductive cough, headache, and body aches since last evening.  He reports that all of the other members of his household have been sick with various illnesses of similar symptomology.  He denies ear pain, shortness breath or wheezing, GI complaints.  Patient is febrile here in clinic with a temperature of 100.9.  History reviewed. No pertinent past medical history.  There are no problems to display for this patient.   Past Surgical History:  Procedure Laterality Date   NO PAST SURGERIES         Home Medications    Prior to Admission medications   Medication Sig Start Date End Date Taking? Authorizing Provider  benzonatate (TESSALON) 100 MG capsule Take 2 capsules (200 mg total) by mouth every 8 (eight) hours. 05/06/21  Yes Becky Augusta, NP  ipratropium (ATROVENT) 0.06 % nasal spray Place 2 sprays into both nostrils 4 (four) times daily. 05/06/21  Yes Becky Augusta, NP  promethazine-dextromethorphan (PROMETHAZINE-DM) 6.25-15 MG/5ML syrup Take 5 mLs by mouth 4 (four) times daily as needed. 05/06/21  Yes Becky Augusta, NP    Family History History reviewed. No pertinent family history.  Social History Social History   Tobacco Use   Smoking status: Former   Smokeless tobacco: Never  Building services engineer Use: Never used  Substance Use Topics   Alcohol use: No   Drug use: No     Allergies   Patient has no known allergies.   Review of Systems Review of Systems  Constitutional:  Positive for chills. Negative for activity change, appetite change and fever.  HENT:  Positive for  congestion, rhinorrhea and sore throat. Negative for ear pain.   Respiratory:  Positive for cough. Negative for shortness of breath and wheezing.   Gastrointestinal:  Negative for diarrhea, nausea and vomiting.  Musculoskeletal:  Positive for arthralgias and myalgias.  Skin:  Negative for rash.  Neurological:  Positive for headaches.  Hematological: Negative.   Psychiatric/Behavioral: Negative.      Physical Exam Triage Vital Signs ED Triage Vitals  Enc Vitals Group     BP 05/06/21 1905 125/78     Pulse Rate 05/06/21 1905 (!) 102     Resp 05/06/21 1905 18     Temp 05/06/21 1905 (!) 100.9 F (38.3 C)     Temp Source 05/06/21 1905 Oral     SpO2 05/06/21 1905 98 %     Weight 05/06/21 1903 210 lb (95.3 kg)     Height 05/06/21 1903 5\' 9"  (1.753 m)     Head Circumference --      Peak Flow --      Pain Score 05/06/21 1903 5     Pain Loc --      Pain Edu? --      Excl. in GC? --    No data found.  Updated Vital Signs BP 125/78 (BP Location: Left Arm)   Pulse (!) 102   Temp (!) 100.9 F (38.3 C) (Oral)   Resp 18   Ht 5\' 9"  (1.753 m)   Wt 210  lb (95.3 kg)   SpO2 98%   BMI 31.01 kg/m   Visual Acuity Right Eye Distance:   Left Eye Distance:   Bilateral Distance:    Right Eye Near:   Left Eye Near:    Bilateral Near:     Physical Exam Vitals and nursing note reviewed.  Constitutional:      General: He is not in acute distress.    Appearance: Normal appearance. He is not ill-appearing.  HENT:     Head: Normocephalic and atraumatic.     Right Ear: Tympanic membrane, ear canal and external ear normal. There is no impacted cerumen.     Left Ear: Tympanic membrane, ear canal and external ear normal. There is no impacted cerumen.     Nose: Congestion and rhinorrhea present.     Mouth/Throat:     Mouth: Mucous membranes are moist.     Pharynx: Oropharynx is clear. Posterior oropharyngeal erythema present.  Cardiovascular:     Rate and Rhythm: Normal rate and regular  rhythm.     Pulses: Normal pulses.     Heart sounds: Normal heart sounds. No murmur heard.   No gallop.  Pulmonary:     Effort: Pulmonary effort is normal.     Breath sounds: Normal breath sounds. No wheezing, rhonchi or rales.  Musculoskeletal:     Cervical back: Normal range of motion and neck supple.  Lymphadenopathy:     Cervical: No cervical adenopathy.  Skin:    General: Skin is warm and dry.     Capillary Refill: Capillary refill takes less than 2 seconds.     Findings: No erythema or rash.  Neurological:     General: No focal deficit present.     Mental Status: He is alert and oriented to person, place, and time.  Psychiatric:        Mood and Affect: Mood normal.        Behavior: Behavior normal.        Thought Content: Thought content normal.        Judgment: Judgment normal.     UC Treatments / Results  Labs (all labs ordered are listed, but only abnormal results are displayed) Labs Reviewed  RAPID INFLUENZA A&B ANTIGENS    EKG   Radiology No results found.  Procedures Procedures (including critical care time)  Medications Ordered in UC Medications  acetaminophen (TYLENOL) tablet 1,000 mg (1,000 mg Oral Given 05/06/21 1914)    Initial Impression / Assessment and Plan / UC Course  I have reviewed the triage vital signs and the nursing notes.  Pertinent labs & imaging results that were available during my care of the patient were reviewed by me and considered in my medical decision making (see chart for details).  Patient is a pleasant, nontoxic-appearing 35 year old male here for evaluation of flulike symptoms as outlined in HPI above.  Patient's physical exam reveals pearly gray tympanic membranes bilaterally with normal light reflex and clear external auditory canals.  Nasal mucosa is erythematous and edematous with clear nasal discharge.  Oropharyngeal exam reveals posterior oropharyngeal erythema and injection with clear postnasal drip.  No cervical  lymphadenopathy appreciated on exam.  Cardiopulmonary exam reveals clear lung sounds in all fields.  Will swab patient for influenza.  Rapid influenza is negative.  Will discharge patient home with a diagnosis of viral URI with cough and treat with Tylenol and ibuprofen for fever and body aches, Atrovent nasal spray to up nasal congestion, Tessalon Perles and Promethazine DM  cough syrup to help with cough and congestion.  Work note provided.   Final Clinical Impressions(s) / UC Diagnoses   Final diagnoses:  Viral URI with cough     Discharge Instructions      Use the Atrovent nasal spray, 2 squirts in each nostril every 6 hours, as needed for runny nose and postnasal drip.  Use the Tessalon Perles every 8 hours during the day.  Take them with a small sip of water.  They may give you some numbness to the base of your tongue or a metallic taste in your mouth, this is normal.  Use the Promethazine DM cough syrup at bedtime for cough and congestion.  It will make you drowsy so do not take it during the day.  Use over-the-counter Tylenol and ibuprofen according to the package instructions as needed for fever and body aches.  Return for reevaluation or see your primary care provider for any new or worsening symptoms.      ED Prescriptions     Medication Sig Dispense Auth. Provider   benzonatate (TESSALON) 100 MG capsule Take 2 capsules (200 mg total) by mouth every 8 (eight) hours. 21 capsule Becky Augusta, NP   ipratropium (ATROVENT) 0.06 % nasal spray Place 2 sprays into both nostrils 4 (four) times daily. 15 mL Becky Augusta, NP   promethazine-dextromethorphan (PROMETHAZINE-DM) 6.25-15 MG/5ML syrup Take 5 mLs by mouth 4 (four) times daily as needed. 118 mL Becky Augusta, NP      PDMP not reviewed this encounter.   Becky Augusta, NP 05/06/21 1936

## 2021-05-06 NOTE — Discharge Instructions (Addendum)
Use the Atrovent nasal spray, 2 squirts in each nostril every 6 hours, as needed for runny nose and postnasal drip.  Use the Tessalon Perles every 8 hours during the day.  Take them with a small sip of water.  They may give you some numbness to the base of your tongue or a metallic taste in your mouth, this is normal.  Use the Promethazine DM cough syrup at bedtime for cough and congestion.  It will make you drowsy so do not take it during the day.  Use over-the-counter Tylenol and ibuprofen according to the package instructions as needed for fever and body aches.  Return for reevaluation or see your primary care provider for any new or worsening symptoms.

## 2021-05-20 ENCOUNTER — Encounter: Payer: Self-pay | Admitting: Emergency Medicine

## 2021-05-20 ENCOUNTER — Ambulatory Visit
Admission: EM | Admit: 2021-05-20 | Discharge: 2021-05-20 | Disposition: A | Discharge status: Home / Self Care | Payer: Medicare Other | Attending: Student | Admitting: Student

## 2021-05-20 ENCOUNTER — Other Ambulatory Visit: Payer: Self-pay

## 2021-05-20 DIAGNOSIS — J069 Acute upper respiratory infection, unspecified: Secondary | ICD-10-CM

## 2021-05-20 MED ORDER — AZITHROMYCIN 250 MG PO TABS: ORAL_TABLET | ORAL | 0 refills | Status: DC

## 2021-05-20 MED ORDER — METHYLPREDNISOLONE 4 MG PO TBPK: ORAL_TABLET | ORAL | 0 refills | Status: DC

## 2021-05-20 NOTE — ED Provider Notes (Signed)
[facility]    CSN: [id number] Arrival date & time: 05/20/21  1647      History   Chief Complaint Chief Complaint  Patient presents with   Cough    HPI Marcus Woods is a 35 y.o. male who presents today for evaluation of ongoing cough.  The patient was evaluated on 05/06/2021 where he was diagnosed with a viral upper respiratory infection.  The patient was prescribed Tessalon, Atrovent nasal spray in addition to Promethazine DM cough syrup to take for nighttime symptoms.  The patient did undergo a rapid influenza test which was negative.  The patient states that most if not all of his symptoms have improved however he continues to report a cough.  This cough is productive, he denies any shortness of breath or chest pain.  He denies any facial pressure or sore throat.  He does report some occasional fevers at home.  He denies any COVID exposures.  Denies any chills or body aches.  Denies any nausea vomiting or diarrhea.  Denies any vision changes or headaches.   Cough Associated symptoms: no wheezing    History reviewed. No pertinent past medical history.  There are no problems to display for this patient.   Past Surgical History:  Procedure Laterality Date   NO PAST SURGERIES         Home Medications    Prior to Admission medications   Medication Sig Start Date End Date Taking? Authorizing Provider  azithromycin (ZITHROMAX Z-PAK) 250 MG tablet Take per package instructions. 05/20/21  Yes Anson Oregon, PA-C  methylPREDNISolone (MEDROL DOSEPAK) 4 MG TBPK tablet Take per package instructions. 05/20/21  Yes Anson Oregon, PA-C  benzonatate (TESSALON) 100 MG capsule Take 2 capsules (200 mg total) by mouth every 8 (eight) hours. 05/06/21   Becky Augusta, NP  ipratropium (ATROVENT) 0.06 % nasal spray Place 2 sprays into both nostrils 4 (four) times daily. 05/06/21   Becky Augusta, NP  promethazine-dextromethorphan (PROMETHAZINE-DM) 6.25-15 MG/5ML syrup Take 5  mLs by mouth 4 (four) times daily as needed. 05/06/21   Becky Augusta, NP    Family History History reviewed. No pertinent family history.  Social History Social History   Tobacco Use   Smoking status: Former   Smokeless tobacco: Never  Building services engineer Use: Never used  Substance Use Topics   Alcohol use: No   Drug use: No     Allergies   Patient has no known allergies.   Review of Systems Review of Systems  Respiratory:  Positive for cough. Negative for choking, chest tightness and wheezing.   All other systems reviewed and are negative.   Physical Exam Triage Vital Signs ED Triage Vitals  Enc Vitals Group     BP 05/20/21 1752 122/88     Pulse Rate 05/20/21 1752 81     Resp 05/20/21 1752 18     Temp 05/20/21 1752 98.2 F (36.8 C)     Temp Source 05/20/21 1752 Oral     SpO2 05/20/21 1752 100 %     Weight --      Height --      Head Circumference --      Peak Flow --      Pain Score 05/20/21 1750 0     Pain Loc --      Pain Edu? --      Excl. in GC? --    No data found.  Updated Vital Signs BP 122/88 (BP Location:  Left Arm)   Pulse 81   Temp 98.2 F (36.8 C) (Oral)   Resp 18   SpO2 100%   Visual Acuity Right Eye Distance:   Left Eye Distance:   Bilateral Distance:    Right Eye Near:   Left Eye Near:    Bilateral Near:     Physical Exam Constitutional:      Appearance: Normal appearance. He is not ill-appearing or toxic-appearing.  HENT:     Right Ear: Tympanic membrane and ear canal normal.     Left Ear: Tympanic membrane and ear canal normal.     Nose: Rhinorrhea present.     Mouth/Throat:     Mouth: Mucous membranes are moist.     Pharynx: Oropharynx is clear. No posterior oropharyngeal erythema.  Eyes:     Extraocular Movements: Extraocular movements intact.     Conjunctiva/sclera: Conjunctivae normal.     Pupils: Pupils are equal, round, and reactive to light.  Cardiovascular:     Rate and Rhythm: Normal rate and regular rhythm.      Heart sounds: No murmur heard.   No friction rub. No gallop.  Pulmonary:     Effort: Pulmonary effort is normal.     Breath sounds: No wheezing or rhonchi.  Abdominal:     General: Abdomen is flat. Bowel sounds are normal.     Palpations: Abdomen is soft.     Tenderness: There is no abdominal tenderness.  Musculoskeletal:     Cervical back: Normal range of motion.  Lymphadenopathy:     Cervical: No cervical adenopathy.  Neurological:     Mental Status: He is alert.     UC Treatments / Results  Labs (all labs ordered are listed, but only abnormal results are displayed) Labs Reviewed - No data to display  EKG   Radiology No results found.  Procedures Procedures (including critical care time)  Medications Ordered in UC Medications - No data to display  Initial Impression / Assessment and Plan / UC Course  I have reviewed the triage vital signs and the nursing notes.  Pertinent labs & imaging results that were available during my care of the patient were reviewed by me and considered in my medical decision making (see chart for details).     1.  Treatment options were discussed today with the patient. 2.  Patient continues to have cough over the past several weeks despite over-the-counter medications. 3.  The patient was given a Z-Pak in addition to a Medrol Dosepak to take at this time. 4.  Encouraged using over-the-counter Mucinex.  Work note provided for [employer]. 5.  Follow-up as needed at this time. Final Clinical Impressions(s) / UC Diagnoses   Final diagnoses:  Upper respiratory tract infection, unspecified type     Discharge Instructions      -Begin taking over-the-counter Mucinex as instructed. -Begin and complete Z-Pak prescribed today.  Complete prednisone taper. -Continue with daily nasal spray and allergy medication. -Follow-up if no improvement of symptoms.   ED Prescriptions     Medication Sig Dispense Auth. Provider    methylPREDNISolone (MEDROL DOSEPAK) 4 MG TBPK tablet Take per package instructions. 21 tablet Blanchard Mane Lance, PA-C   azithromycin (ZITHROMAX Z-PAK) 250 MG tablet Take per package instructions. 6 tablet Anson Oregon, PA-C      PDMP not reviewed this encounter.   Anson Oregon, PA-C 05/20/21 2030

## 2021-05-20 NOTE — ED Triage Notes (Signed)
Pt presents today with c/o of continued cough. He was seen here on 05/06/21 for same.

## 2021-05-20 NOTE — Discharge Instructions (Signed)
-  Begin taking over-the-counter Mucinex as instructed. -Begin and complete Z-Pak prescribed today.  Complete prednisone taper. -Continue with daily nasal spray and allergy medication. -Follow-up if no improvement of symptoms.

## 2021-08-03 ENCOUNTER — Other Ambulatory Visit: Payer: Self-pay

## 2021-08-03 ENCOUNTER — Ambulatory Visit
Admission: EM | Admit: 2021-08-03 | Discharge: 2021-08-03 | Disposition: A | Discharge status: Home / Self Care | Payer: Medicare Other | Attending: Emergency Medicine | Admitting: Emergency Medicine

## 2021-08-03 ENCOUNTER — Encounter: Payer: Self-pay | Admitting: Emergency Medicine

## 2021-08-03 DIAGNOSIS — R519 Headache, unspecified: Secondary | ICD-10-CM

## 2021-08-03 DIAGNOSIS — Z72820 Sleep deprivation: Secondary | ICD-10-CM | POA: Diagnosis not present

## 2021-08-03 DIAGNOSIS — J069 Acute upper respiratory infection, unspecified: Secondary | ICD-10-CM | POA: Diagnosis not present

## 2021-08-03 MED ORDER — IPRATROPIUM BROMIDE 0.06 % NA SOLN: 2.0000 | Freq: Four times a day (QID) | NASAL | 12 refills | Status: DC

## 2021-08-03 NOTE — ED Triage Notes (Signed)
Pt c/o headache or the last couple of days. Pain is located in the forehead area. Denies trauma. He states he has not been able to sleep due to his headache. Denies blurred vision or dizziness. He states tried BC powder, once.

## 2021-08-03 NOTE — ED Provider Notes (Signed)
[facility]    CSN: [id number] Arrival date & time: 08/03/21  0957      History   Chief Complaint Chief Complaint  Patient presents with   Headache    HPI Marcus Woods is a 36 y.o. male.   HPI  36 year old male here for evaluation of headache.  Patient ports that he had a frontal headache for the last 2 days.  This not associated with any trauma but he does report that he has been having difficulty with his sleep.  He believes it secondary to the stress of his job.  He does endorse some runny nose and clear nasal discharge as well.  He states that his pain is dull and rates it a 5-6/10.  He denies any changes in vision, nausea, vomiting, injury, or ear pain.  Patient also denies dizziness.  Today had a normal eye exam 2 months ago.  Patient reports that he took a single BC powder a day or so ago without any improvement of his symptoms.  History reviewed. No pertinent past medical history.  There are no problems to display for this patient.   Past Surgical History:  Procedure Laterality Date   NO PAST SURGERIES         Home Medications    Prior to Admission medications   Medication Sig Start Date End Date Taking? Authorizing Provider  ipratropium (ATROVENT) 0.06 % nasal spray Place 2 sprays into both nostrils 4 (four) times daily. 08/03/21  Yes Margarette Canada, NP    Family History No family history on file.  Social History Social History   Tobacco Use   Smoking status: Former   Smokeless tobacco: Never  Scientific laboratory technician Use: Never used  Substance Use Topics   Alcohol use: Not Currently   Drug use: Yes    Types: Marijuana     Allergies   Patient has no known allergies.   Review of Systems Review of Systems  Constitutional:  Negative for activity change, appetite change and fever.  HENT:  Positive for congestion and rhinorrhea. Negative for ear pain.   Eyes:  Negative for visual disturbance.  Respiratory:  Negative for cough, shortness  of breath and wheezing.   Gastrointestinal:  Negative for nausea and vomiting.  Neurological:  Positive for headaches. Negative for dizziness.  Hematological: Negative.   Psychiatric/Behavioral: Negative.      Physical Exam Triage Vital Signs ED Triage Vitals  Enc Vitals Group     BP 08/03/21 1059 125/79     Pulse Rate 08/03/21 1059 76     Resp 08/03/21 1059 18     Temp 08/03/21 1059 98.4 F (36.9 C)     Temp Source 08/03/21 1059 Oral     SpO2 08/03/21 1059 99 %     Weight 08/03/21 1057 210 lb 1.6 oz (95.3 kg)     Height 08/03/21 1057 _0  (1.753 m)     Head Circumference --      Peak Flow --      Pain Score 08/03/21 1056 6     Pain Loc --      Pain Edu? --      Excl. in [place]? --    No data found.  Updated Vital Signs BP 125/79 (BP Location: Left Arm)    Pulse 76    Temp 98.4 F (36.9 C) (Oral)    Resp 18    Ht _1  (1.753 m)    Wt 210 lb 1.6 oz (  95.3 kg)    SpO2 99%    BMI 31.03 kg/m   Visual Acuity Right Eye Distance:   Left Eye Distance:   Bilateral Distance:    Right Eye Near:   Left Eye Near:    Bilateral Near:     Physical Exam Vitals and nursing note reviewed.  Constitutional:      General: He is not in acute distress.    Appearance: Normal appearance. He is not ill-appearing.  HENT:     Head: Normocephalic and atraumatic.     Right Ear: Tympanic membrane, ear canal and external ear normal. There is no impacted cerumen.     Left Ear: Tympanic membrane, ear canal and external ear normal. There is no impacted cerumen.     Nose: Congestion and rhinorrhea present.     Mouth/Throat:     Mouth: Mucous membranes are moist.     Pharynx: Oropharynx is clear. No posterior oropharyngeal erythema.  Eyes:     Extraocular Movements: Extraocular movements intact.     Pupils: Pupils are equal, round, and reactive to light.  Cardiovascular:     Rate and Rhythm: Normal rate and regular rhythm.     Pulses: Normal pulses.     Heart sounds: Normal heart sounds. No  murmur heard.   No friction rub. No gallop.  Pulmonary:     Effort: Pulmonary effort is normal.     Breath sounds: Normal breath sounds. No wheezing, rhonchi or rales.  Musculoskeletal:     Cervical back: Normal range of motion and neck supple.  Lymphadenopathy:     Cervical: No cervical adenopathy.  Skin:    General: Skin is warm and dry.     Capillary Refill: Capillary refill takes less than 2 seconds.     Findings: No erythema or rash.  Neurological:     General: No focal deficit present.     Mental Status: He is alert and oriented to person, place, and time.  Psychiatric:        Mood and Affect: Mood normal.        Behavior: Behavior normal.        Thought Content: Thought content normal.        Judgment: Judgment normal.     UC Treatments / Results  Labs (all labs ordered are listed, but only abnormal results are displayed) Labs Reviewed - No data to display  EKG   Radiology No results found.  Procedures Procedures (including critical care time)  Medications Ordered in UC Medications - No data to display  Initial Impression / Assessment and Plan / UC Course  I have reviewed the triage vital signs and the nursing notes.  Pertinent labs & imaging results that were available during my care of the patient were reviewed by me and considered in my medical decision making (see chart for details).  Patient is a nontoxic-appearing 36 year old male here for evaluation of 2 days worth of frontal headache that is associated with runny nose and clear nasal discharge.  Patient also has had poor sleep quality and states that his job is very stressful.  He works at [employer] in [employer] and he does the [employer].  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  He reports that he gets off and goes home and falls asleep about 12:30 in the morning but wakes up about 2:00 and has difficulty going back to sleep.  This is been going on for  some time  now.  His runny nose nasal congestion were present a couple days ago and he reports that he still feels a little congested but he denies any runny nose at present.  He does blow his nose for clear nasal discharge.  On exam patient has pearly-gray tympanic membranes bilaterally with normal light reflex and clear external auditory canals.  Pupils are equal round reactive and EOMs intact.  Nasal mucosa is erythematous and edematous with scant clear discharge in both nares.  Oropharyngeal exam is benign.  No cervical lymphadenopathy appreciable exam.  Cardiopulmonary exam reveals clear lung sounds in all fields.  Suspect patient's headache is accommodation of upper respiratory symptoms, most likely viral upper respiratory infection, poor sleep quality, and stress.  I will give him some Atrovent nasal spray to help with nasal congestion to see if this improves his symptoms.  I have also encouraged him to perform sinus irrigation to help wash away any harboring mucus which may be causing inflammation of his sinuses.  He is to use over-the-counter Tylenol and ibuprofen as directed for his pain.  I have also advised him to use over-the-counter supplements to help with his sleep to include calm and melatonin.  I have also suggested guided imagery as a way to deal with stress as well as help with sleep quality.  Work note provided.   Final Clinical Impressions(s) / UC Diagnoses   Final diagnoses:  Upper respiratory tract infection, unspecified type  Acute intractable headache, unspecified headache type  Poor sleep     Discharge Instructions      Use the Atrovent nasal spray, 2 squirts in each nostril every 6 hours, as needed for runny nose and postnasal drip.  Perform sinus irrigation with distilled water and a Neilmed sinus rinse kit 2-3 times a day.  For your sleep you can take OTC Calm supplement  to help you get to sleep. If your wake up take 5 mg of OTC Melatonin  You can also use online  guided meditation to help with stress and your sleep quality.  Use OTC Ibuprofen 600 mg every 6 hours with 1000 mg Tylenol, take this with food.   Return for reevaluation or see your primary care provider for any new or worsening symptoms.      ED Prescriptions     Medication Sig Dispense Auth. Provider   ipratropium (ATROVENT) 0.06 % nasal spray Place 2 sprays into both nostrils 4 (four) times daily. 15 mL Margarette Canada, NP      PDMP not reviewed this encounter.   Margarette Canada, NP 08/03/21 1216

## 2021-08-03 NOTE — Discharge Instructions (Addendum)
Use the Atrovent nasal spray, 2 squirts in each nostril every 6 hours, as needed for runny nose and postnasal drip.  Perform sinus irrigation with distilled water and a Neilmed sinus rinse kit 2-3 times a day.  For your sleep you can take OTC Calm supplement  to help you get to sleep. If your wake up take 5 mg of OTC Melatonin  You can also use online guided meditation to help with stress and your sleep quality.  Use OTC Ibuprofen 600 mg every 6 hours with 1000 mg Tylenol, take this with food.   Return for reevaluation or see your primary care provider for any new or worsening symptoms.

## 2022-01-15 IMAGING — CR DG CHEST 2V
1 series · 2 of 2 positions shown · non-contrast
Comparison: 01/03/2012

CLINICAL DATA: Shortness of breath.

EXAM:
CHEST - 2 VIEW

[Series 1: dg chest 2 view · 0.14mm/px · 2 of 2 slices shown]
[im 1/2]
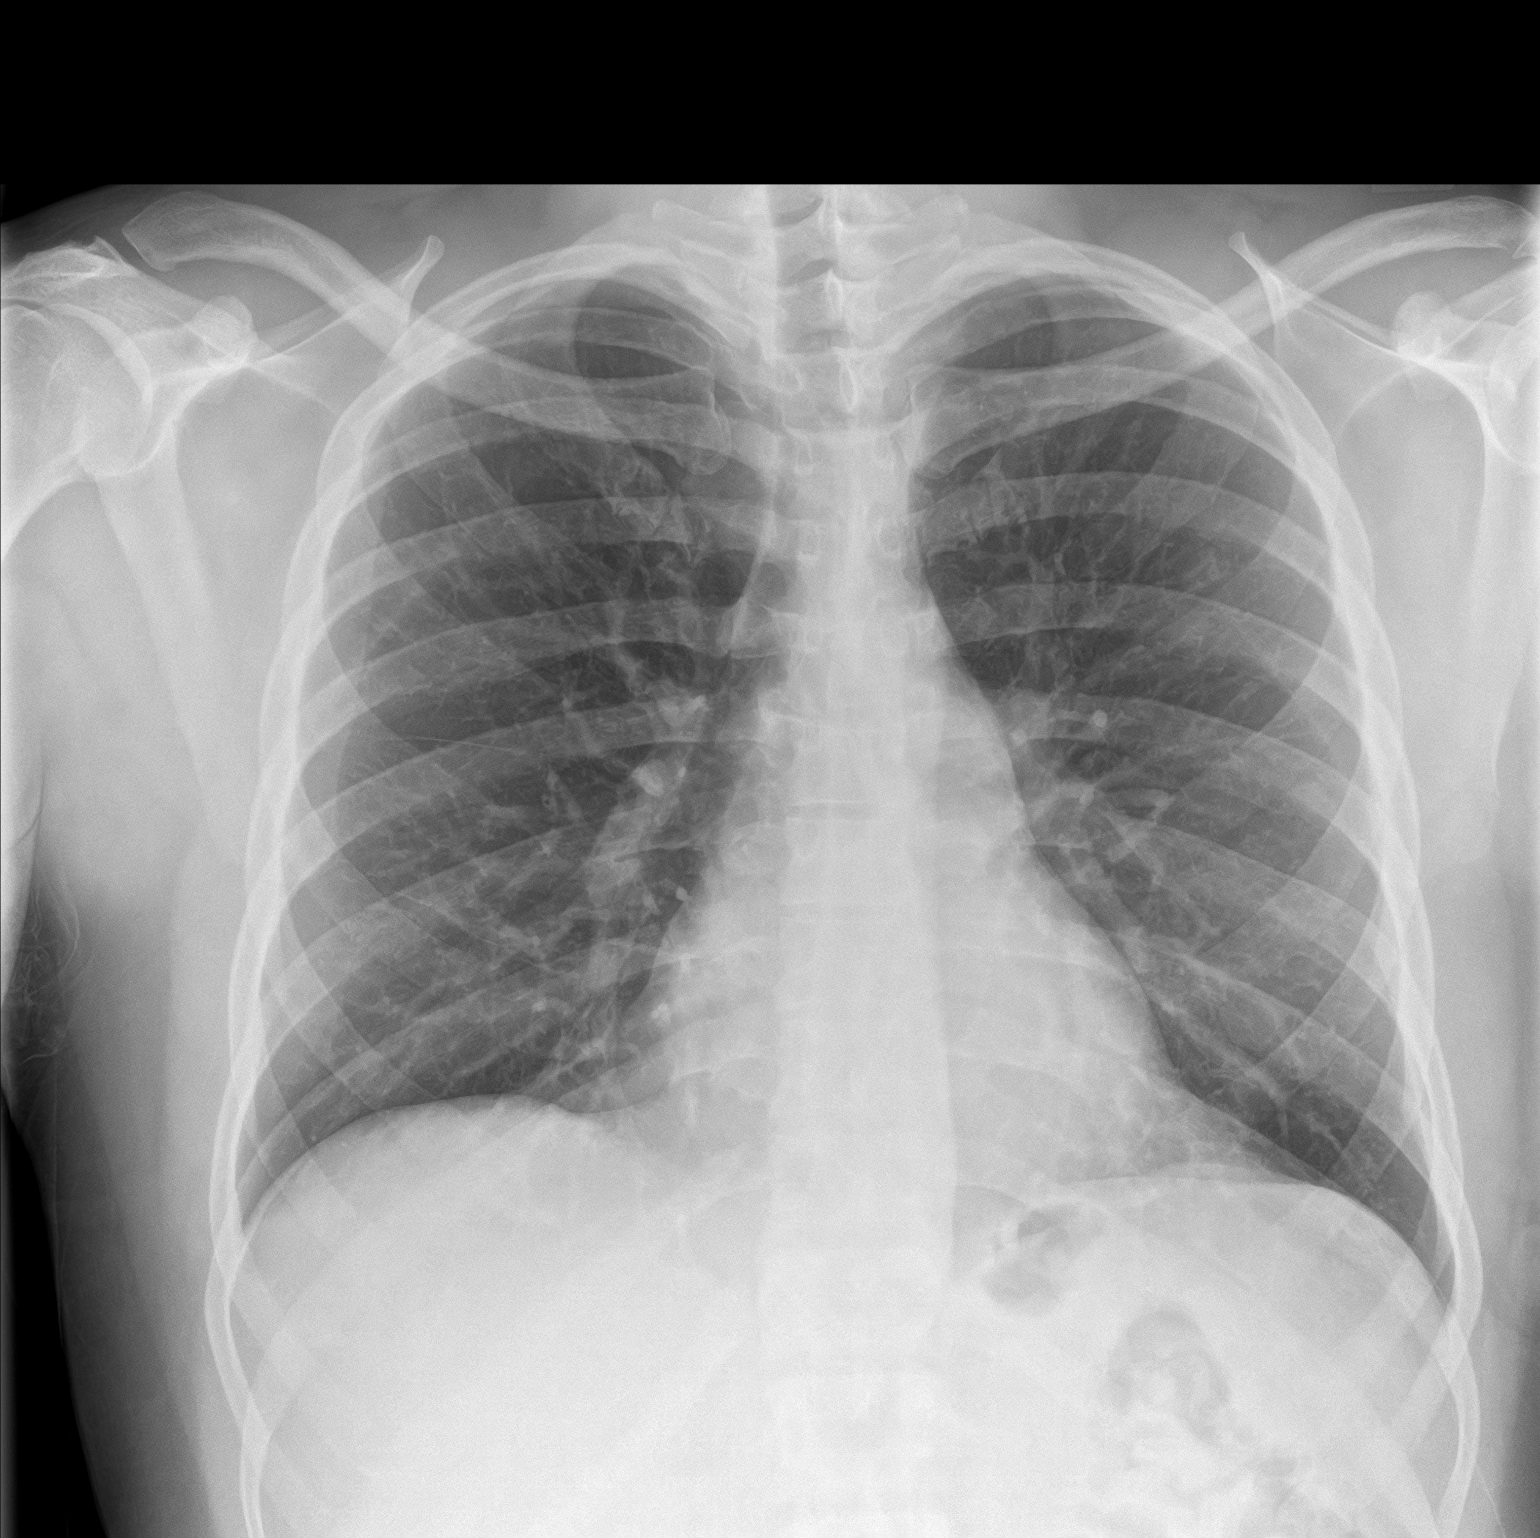
[im 2/2]
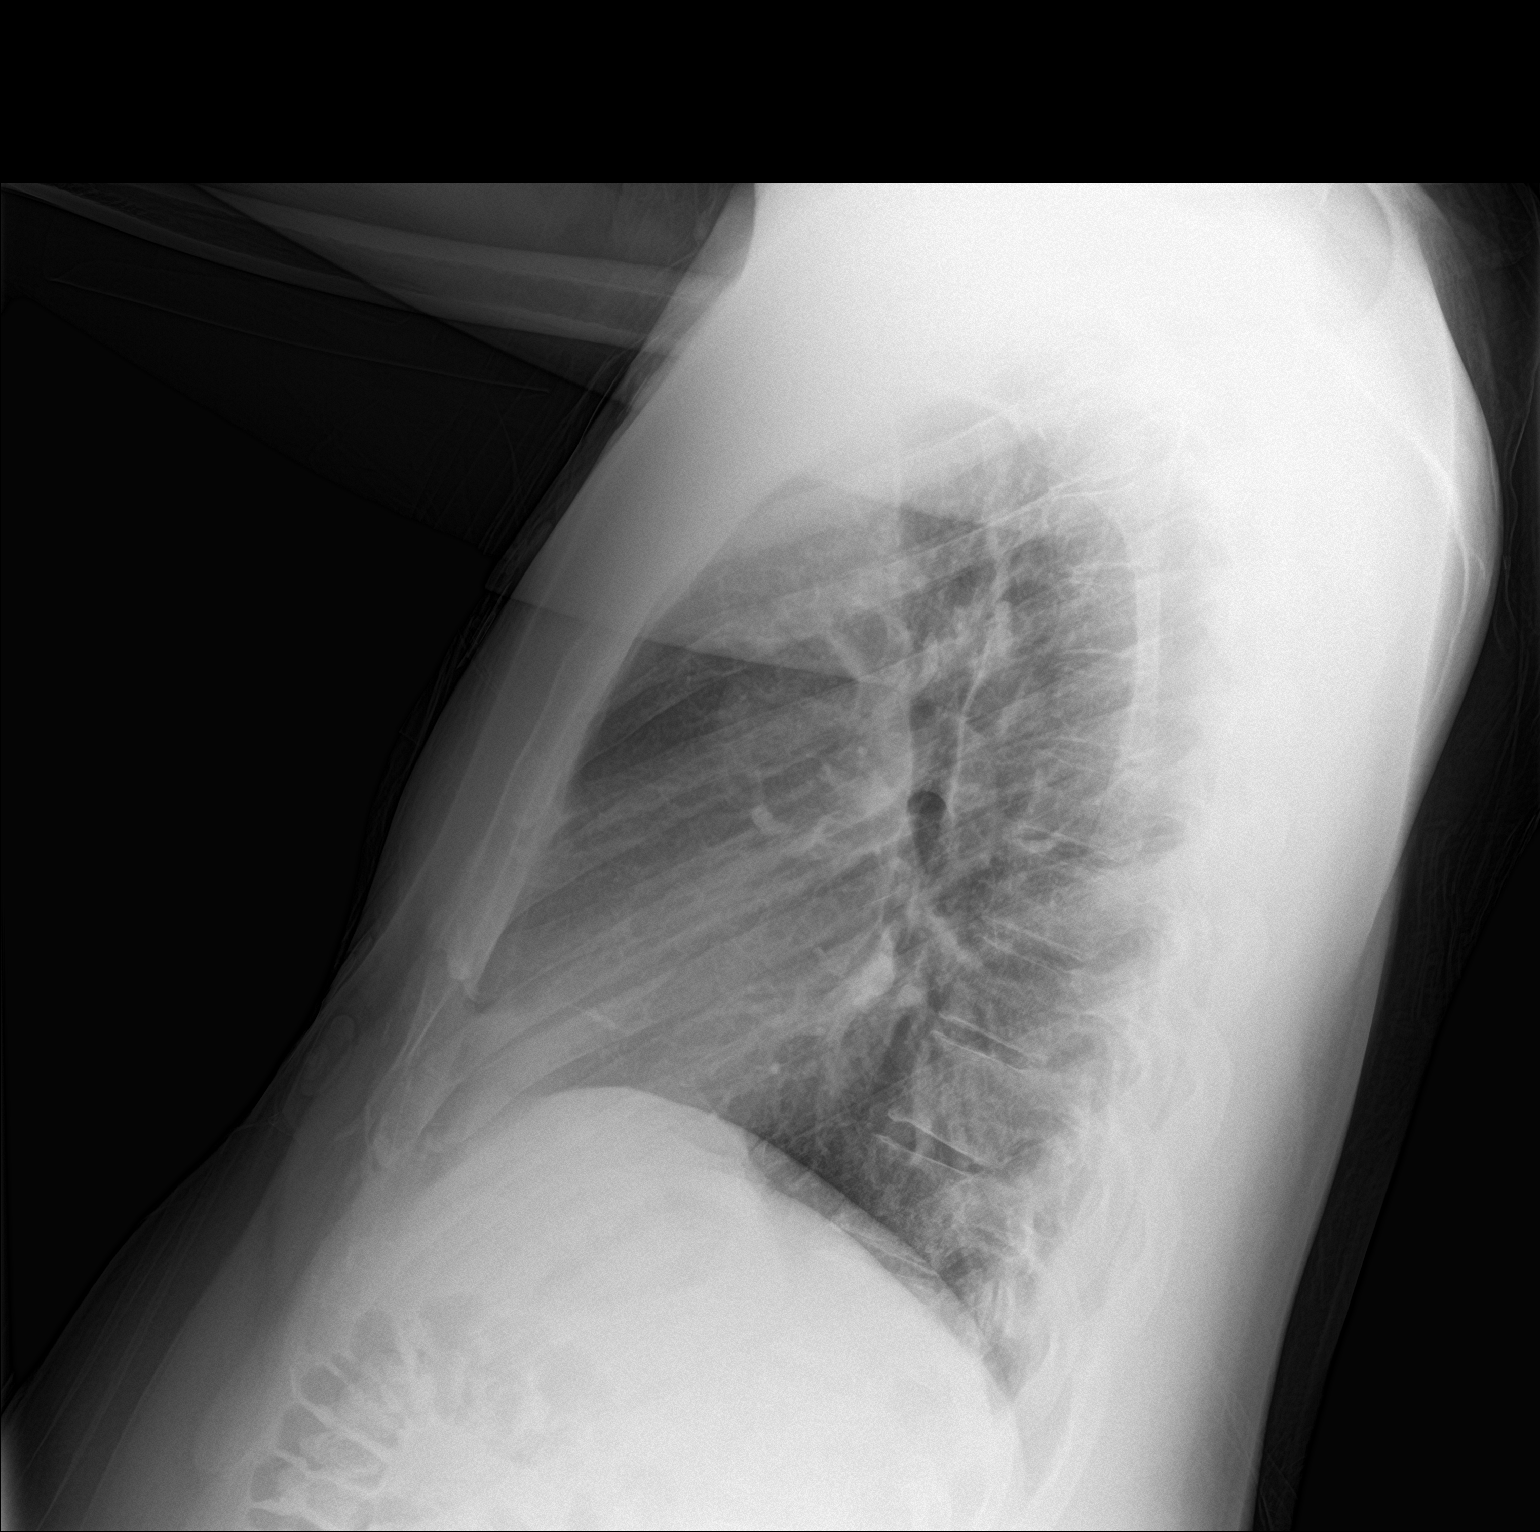

[2 of 2 positions shown; findings below may reference images not displayed]

FINDINGS: The heart size and mediastinal contours are within normal limits.
Both lungs are clear. The visualized skeletal structures are
unremarkable.
IMPRESSION: No active cardiopulmonary disease.

## 2022-03-23 ENCOUNTER — Ambulatory Visit
Admission: EM | Admit: 2022-03-23 | Discharge: 2022-03-23 | Disposition: A | Discharge status: Home / Self Care | Payer: Medicare Other | Attending: Emergency Medicine | Admitting: Emergency Medicine

## 2022-03-23 DIAGNOSIS — L02214 Cutaneous abscess of groin: Secondary | ICD-10-CM | POA: Diagnosis not present

## 2022-03-23 MED ORDER — DOXYCYCLINE HYCLATE 100 MG PO CAPS: 100.0000 mg | ORAL_CAPSULE | Freq: Two times a day (BID) | ORAL | 0 refills | Status: DC

## 2022-03-23 MED ORDER — IBUPROFEN 800 MG PO TABS: 800.0000 mg | ORAL_TABLET | Freq: Three times a day (TID) | ORAL | 0 refills | Status: DC

## 2022-03-23 NOTE — ED Provider Notes (Signed)
[facility]    CSN: [id number] Arrival date & time: 03/23/22  1219      History   Chief Complaint Chief Complaint  Patient presents with   Mass    HPI Marcus Woods is a 36 y.o. male.   Patient presents with a mass to the right groin beginning 3 days ago.  Has fluctuated in size.  Site has become tender and pain is worsened when bending.  Endorses it feels as if it is bulging from the groin.  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  Denies drainage, fever, chills, changes in urination or penile discharge.  Sexually active, 1 partner, no known exposures. has not attempted treatment    History reviewed. No pertinent past medical history.  There are no problems to display for this patient.   Past Surgical History:  Procedure Laterality Date   NO PAST SURGERIES         Home Medications    Prior to Admission medications   Medication Sig Start Date End Date Taking? Authorizing Provider  ipratropium (ATROVENT) 0.06 % nasal spray Place 2 sprays into both nostrils 4 (four) times daily. 08/03/21   Margarette Canada, NP    Family History History reviewed. No pertinent family history.  Social History Social History   Tobacco Use   Smoking status: Former   Smokeless tobacco: Never  Scientific laboratory technician Use: Never used  Substance Use Topics   Alcohol use: Not Currently   Drug use: Yes    Types: Marijuana     Allergies   Patient has no known allergies.   Review of Systems Review of Systems Defer to HPI    Physical Exam Triage Vital Signs ED Triage Vitals  Enc Vitals Group     BP 03/23/22 1240 110/74     Pulse Rate 03/23/22 1240 (!) 109     Resp 03/23/22 1240 18     Temp 03/23/22 1240 98.1 F (36.7 C)     Temp Source 03/23/22 1240 Oral     SpO2 03/23/22 1240 95 %     Weight 03/23/22 1237 210 lb (95.3 kg)     Height 03/23/22 1237 5\' 11"  (1.803 m)     Head Circumference --      Peak Flow --      Pain Score 03/23/22 1237 7      Pain Loc --      Pain Edu? --      Excl. in [place]? --    No data found.  Updated Vital Signs BP 110/74 (BP Location: Left Arm)   Pulse (!) 109   Temp 98.1 F (36.7 C) (Oral)   Resp 18   Ht 5\' 11"  (1.803 m)   Wt 210 lb (95.3 kg)   SpO2 95%   BMI 29.29 kg/m   Visual Acuity Right Eye Distance:   Left Eye Distance:   Bilateral Distance:    Right Eye Near:   Left Eye Near:    Bilateral Near:     Physical Exam Exam conducted with a chaperone present.  Constitutional:      Appearance: Normal appearance.  Eyes:     Extraocular Movements: Extraocular movements intact.  Pulmonary:     Effort: Pulmonary effort is normal.  Genitourinary:      Comments: 1 x 2 cm immature abscess present to the right groin, no abnormalities to the penis, no penile drainage present Neurological:     Mental Status: He is  alert and oriented to person, place, and time. Mental status is at baseline.  Psychiatric:        Behavior: Behavior normal.      UC Treatments / Results  Labs (all labs ordered are listed, but only abnormal results are displayed) Labs Reviewed - No data to display  EKG   Radiology No results found.  Procedures Procedures (including critical care time)  Medications Ordered in UC Medications - No data to display  Initial Impression / Assessment and Plan / UC Course  I have reviewed the triage vital signs and the nursing notes.  Pertinent labs & imaging results that were available during my care of the patient were reviewed by me and considered in my medical decision making (see chart for details).  Abscess of right groin  Unable to complete an incision and drainage due to immaturity of abscess, placed on doxycycline and prescribed ibuprofen 800 for additional supportive care, recommended warm compresses or soaks to the affected area to help facilitate drainage, given strict precautions for nonhealing or nondraining site to return urgent care for reevaluation, work  note given   Final Clinical Impressions(s) / UC Diagnoses   Final diagnoses:  None   Discharge Instructions   None    ED Prescriptions   None    PDMP not reviewed this encounter.   Hans Eden, NP 03/23/22 1321

## 2022-03-23 NOTE — Discharge Instructions (Signed)
Take doxycycline every morning and every evening for 7 days  Hold warm-hot compresses to affected area at least 4 times a day, this helps to facilitate draining, the more the better  May use ibuprofen every 8 hours as needed to help with pain, may use Tylenol in addition to this  Do not attempt to drain or pop the area at home unless it is very soft and bouncy and you are able to visualize a possible similar to a pimple on your face, otherwise you will cause further irritation  Please return for evaluation for increased swelling, increased tenderness or pain, non healing site, non draining site, you begin to have fever or chills   We reviewed the etiology of recurrent abscesses of skin.  Skin abscesses are collections of pus within the dermis and deeper skin tissues. Skin abscesses manifest as painful, tender, fluctuant, and erythematous nodules, frequently surmounted by a pustule and surrounded by a rim of erythematous swelling.  Spontaneous drainage of purulent material may occur.  Fever can occur on occasion.    -Skin abscesses can develop in healthy individuals with no predisposing conditions other than skin or nasal carriage of Staphylococcus aureus.  Individuals in close contact with others who have active infection with skin abscesses are at increased risk which is likely to explain why twin brother has similar episodes.   In addition, any process leading to a breach in the skin barrier can also predispose to the development of a skin abscesses, such as atopic dermatitis.

## 2022-03-23 NOTE — ED Triage Notes (Signed)
Pt c/o bump along left upper groin x3days  Pt states that the bump hurts when he crouches or walks.   Pt used OTC cream and it did not help.   Pt states that the bump has varied in size and has swollen and shrunk in the same day.   Pt states that it hurts mostly at night.   Pt states that the bump is hard.

## 2022-06-05 ENCOUNTER — Encounter: Payer: Self-pay | Admitting: Emergency Medicine

## 2022-06-05 ENCOUNTER — Ambulatory Visit
Admission: EM | Admit: 2022-06-05 | Discharge: 2022-06-05 | Disposition: A | Discharge status: Home / Self Care | Payer: Medicare Other | Attending: Internal Medicine | Admitting: Internal Medicine

## 2022-06-05 DIAGNOSIS — M940 Chondrocostal junction syndrome [Tietze]: Secondary | ICD-10-CM

## 2022-06-05 MED ORDER — IBUPROFEN 800 MG PO TABS: 800.0000 mg | ORAL_TABLET | Freq: Three times a day (TID) | ORAL | 0 refills | Status: DC

## 2022-06-05 NOTE — ED Provider Notes (Signed)
[facility]    CSN: [id number] Arrival date & time: 06/05/22  1406      History   Chief Complaint Chief Complaint  Patient presents with   Chest Pain    HPI Marcus Woods is a 36 y.o. male who presents with left chest pain off and on x 1 week which today has radiated to his R chest.  He describes the pain as chest pressure across his chest. Pain is provoked with pressing on it. 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 Denies SOB, sweats, cough, rash. Nothing makes the pain better. Has not tried any meds for this.     History reviewed. No pertinent past medical history.  There are no problems to display for this patient.   Past Surgical History:  Procedure Laterality Date   NO PAST SURGERIES         Home Medications    Prior to Admission medications   Medication Sig Start Date End Date Taking? Authorizing Provider  ibuprofen (ADVIL) 800 MG tablet Take 1 tablet (800 mg total) by mouth 3 (three) times daily. 06/05/22  Yes Rodriguez-Southworth, Viviana Simpler    Family History History reviewed. No pertinent family history.  Social History Social History   Tobacco Use   Smoking status: Former   Smokeless tobacco: Never  Building services engineer Use: Never used  Substance Use Topics   Alcohol use: Not Currently   Drug use: Not Currently    Types: Marijuana     Allergies   Patient has no known allergies.   Review of Systems Review of Systems  Constitutional:  Negative for activity change, appetite change, chills, diaphoresis and fever.  HENT:  Negative for congestion.   Respiratory:  Positive for chest tightness. Negative for cough, shortness of breath and wheezing.   Cardiovascular:  Positive for chest pain. Negative for palpitations.  Skin:  Negative for color change, rash and wound.  Neurological:  Negative for light-headedness and headaches.     Physical Exam Triage Vital Signs ED Triage Vitals  Enc  Vitals Group     BP 06/05/22 1622 112/83     Pulse Rate 06/05/22 1622 69     Resp 06/05/22 1622 16     Temp 06/05/22 1622 98.2 F (36.8 C)     Temp Source 06/05/22 1622 Oral     SpO2 06/05/22 1622 100 %     Weight --      Height --      Head Circumference --      Peak Flow --      Pain Score 06/05/22 1621 6     Pain Loc --      Pain Edu? --      Excl. in [place]? --    No data found.  Updated Vital Signs BP 112/83 (BP Location: Left Arm)   Pulse 69   Temp 98.2 F (36.8 C) (Oral)   Resp 16   SpO2 100%   Visual Acuity Right Eye Distance:   Left Eye Distance:   Bilateral Distance:    Right Eye Near:   Left Eye Near:    Bilateral Near:     Physical Exam Vitals and nursing note reviewed.  Constitutional:      General: He is not in acute distress.    Appearance: He is not toxic-appearing.  HENT:     Right Ear: External ear normal.     Left Ear: External  ear normal.  Eyes:     General: No scleral icterus.    Conjunctiva/sclera: Conjunctivae normal.  Cardiovascular:     Rate and Rhythm: Normal rate and regular rhythm.     Heart sounds: No murmur heard. Pulmonary:     Effort: Pulmonary effort is normal.     Breath sounds: Normal breath sounds.     Comments: I was able to reproduce his chest pain when palpating L upper and mid chest wall Chest:     Chest wall: Tenderness present.  Musculoskeletal:        General: Normal range of motion.     Cervical back: Neck supple.  Skin:    General: Skin is warm and dry.  Neurological:     Mental Status: He is alert and oriented to person, place, and time.     Gait: Gait normal.  Psychiatric:        Mood and Affect: Mood normal.        Behavior: Behavior normal.        Thought Content: Thought content normal.        Judgment: Judgment normal.      UC Treatments / Results  Labs (all labs ordered are listed, but only abnormal results are displayed) Labs Reviewed - No data to display  EKG NSR, unchanged when compared  to EKG from 03/10/2021  Radiology No results found.  Procedures Procedures (including critical care time)  Medications Ordered in UC Medications - No data to display  Initial Impression / Assessment and Plan / UC Course  I have reviewed the triage vital signs and the nursing notes.  Costochondritis   I placed her on Ibuprofen as noted.    Final Clinical Impressions(s) / UC Diagnoses   Final diagnoses:  Costochondritis   Discharge Instructions   None    ED Prescriptions     Medication Sig Dispense Auth. Provider   ibuprofen (ADVIL) 800 MG tablet Take 1 tablet (800 mg total) by mouth 3 (three) times daily. 21 tablet Rodriguez-Southworth, Sunday Spillers, PA-C      PDMP not reviewed this encounter.   Shelby Mattocks, PA-C 06/05/22 1730

## 2022-06-05 NOTE — ED Triage Notes (Signed)
Pt presents with left side and right side chest pain x 1 week. Pt denies any other symptoms and describes the pain as pressure across his chest.

## 2022-08-05 ENCOUNTER — Ambulatory Visit
Admission: EM | Admit: 2022-08-05 | Discharge: 2022-08-05 | Disposition: A | Discharge status: Home / Self Care | Payer: 59 | Attending: Physician Assistant | Admitting: Physician Assistant

## 2022-08-05 DIAGNOSIS — J069 Acute upper respiratory infection, unspecified: Secondary | ICD-10-CM | POA: Diagnosis present

## 2022-08-05 DIAGNOSIS — U071 COVID-19: Secondary | ICD-10-CM | POA: Diagnosis present

## 2022-08-05 LAB — SARS CORONAVIRUS 2 BY RT PCR: SARS Coronavirus 2 by RT PCR: POSITIVE — AB

## 2022-08-05 NOTE — Discharge Instructions (Signed)
URI/COLD SYMPTOMS: Your exam today is consistent with a viral illness. Antibiotics are not indicated at this time. Use medications as directed, including cough syrup, nasal saline, and decongestants. Your symptoms should improve over the next few days and resolve within 7-10 days. Increase rest and fluids. F/u if symptoms worsen or predominate such as sore throat, ear pain, productive cough, shortness of breath, or if you develop high fevers or worsening fatigue over the next several days.    I will contact you if your COVID test is positive.  If positive will need to isolate for couple days and then wear mask x 5 days.  If you are COVID-negative, likely another viral illness and you should be eating better in the next 2 days.  Continue with your over-the-counter medicines.

## 2022-08-05 NOTE — ED Provider Notes (Signed)
[facility]    CSN: [id number] Arrival date & time: 08/05/22  1226      History   Chief Complaint Chief Complaint  Patient presents with   Cough    HPI Marcus Woods is a 37 y.o. male presenting for 3-day history of fatigue, cough, congestion, scratchy throat and voice hoarseness.  Patient reports that he felt feverish at the start but no recorded temperature.  He says that he feels better than he did at onset of symptoms.  He reports possible exposure to COVID-19.  Reports that he has missed work for the past 2 days and will need a note to return.  He has not had any wheezing or breathing difficulty, vomiting, diarrhea, abdominal pain, etc.  No other complaints.  HPI  History reviewed. No pertinent past medical history.  There are no problems to display for this patient.   Past Surgical History:  Procedure Laterality Date   NO PAST SURGERIES         Home Medications    Prior to Admission medications   Medication Sig Start Date End Date Taking? Authorizing Provider  ibuprofen (ADVIL) 800 MG tablet Take 1 tablet (800 mg total) by mouth 3 (three) times daily. 06/05/22   Rodriguez-Southworth, Sunday Spillers, PA-C    Family History History reviewed. No pertinent family history.  Social History Social History   Tobacco Use   Smoking status: Former   Smokeless tobacco: Never  Scientific laboratory technician Use: Never used  Substance Use Topics   Alcohol use: Not Currently   Drug use: Not Currently    Types: Marijuana     Allergies   Patient has no known allergies.   Review of Systems Review of Systems  Constitutional:  Positive for fatigue. Negative for fever.  HENT:  Positive for congestion, rhinorrhea and sore throat. Negative for sinus pressure and sinus pain.   Respiratory:  Positive for cough. Negative for shortness of breath.   Gastrointestinal:  Negative for abdominal pain, diarrhea, nausea and vomiting.  Musculoskeletal:  Negative for myalgias.   Neurological:  Negative for weakness, light-headedness and headaches.  Hematological:  Negative for adenopathy.     Physical Exam Triage Vital Signs ED Triage Vitals [08/05/22 1259]  Enc Vitals Group     BP      Pulse      Resp      Temp      Temp src      SpO2      Weight 209 lb (94.8 kg)     Height 5' 9"$  (1.753 m)     Head Circumference      Peak Flow      Pain Score 0     Pain Loc      Pain Edu?      Excl. in [place]?    No data found.  Updated Vital Signs BP 126/87 (BP Location: Left Arm)   Pulse 82   Temp 98.4 F (36.9 C) (Oral)   Ht 5' 9"$  (1.753 m)   Wt 209 lb (94.8 kg)   SpO2 96%   BMI 30.86 kg/m      Physical Exam Vitals and nursing note reviewed.  Constitutional:      General: He is not in acute distress.    Appearance: Normal appearance. He is well-developed. He is not ill-appearing.  HENT:     Head: Normocephalic and atraumatic.     Nose: Congestion present.     Mouth/Throat:  Mouth: Mucous membranes are moist.     Pharynx: Oropharynx is clear. Posterior oropharyngeal erythema present.  Eyes:     General: No scleral icterus.    Conjunctiva/sclera: Conjunctivae normal.  Cardiovascular:     Rate and Rhythm: Normal rate and regular rhythm.     Heart sounds: Normal heart sounds.  Pulmonary:     Effort: Pulmonary effort is normal. No respiratory distress.     Breath sounds: Normal breath sounds.  Musculoskeletal:     Cervical back: Neck supple.  Skin:    General: Skin is warm and dry.     Capillary Refill: Capillary refill takes less than 2 seconds.  Neurological:     General: No focal deficit present.     Mental Status: He is alert. Mental status is at baseline.     Motor: No weakness.     Gait: Gait normal.  Psychiatric:        Mood and Affect: Mood normal.        Behavior: Behavior normal.      UC Treatments / Results  Labs (all labs ordered are listed, but only abnormal results are displayed) Labs Reviewed  SARS CORONAVIRUS 2 BY  RT PCR - Abnormal; Notable for the following components:      Result Value   SARS Coronavirus 2 by RT PCR POSITIVE (*)    All other components within normal limits    EKG   Radiology No results found.  Procedures Procedures (including critical care time)  Medications Ordered in UC Medications - No data to display  Initial Impression / Assessment and Plan / UC Course  I have reviewed the triage vital signs and the nursing notes.  Pertinent labs & imaging results that were available during my care of the patient were reviewed by me and considered in my medical decision making (see chart for details).   37 year old male presents for 3-day history of cough, congestion, fatigue, scratchy throat and voice hoarseness.  Symptoms have improved from onset and he would like to return to work.  He has missed last 2 days of work.  Possible exposure to COVID-19.  Improvement in his symptoms with over-the-counter DayQuil/NyQuil.  Has not needed any medication today.  Vitals are normal and stable and he is overall well-appearing.  On exam he has nasal congestion, mild posterior pharyngeal erythema with large amount of clear postnasal drainage.  Chest clear to auscultation.  PCR COVID test obtained.  Advised patient I will contact him if results are positive.  Reviewed current CDC guidelines, isolation protocol and ED precautions if positive for COVID.  Reviewed if he does not have any, COVID test is negative and he has other viral illness.  Supportive care encouraged with continuing OTC meds as needed and return for any acute worsening of symptoms.  Positive COVID test.  Numerous attempts made to contact patient but calls go straight to voicemail.  Patient apparently told nursing staff before he left that his phone was broken and he will call back for his results.  Final Clinical Impressions(s) / UC Diagnoses   Final diagnoses:  Viral URI with cough  COVID-19     Discharge Instructions       URI/COLD SYMPTOMS: Your exam today is consistent with a viral illness. Antibiotics are not indicated at this time. Use medications as directed, including cough syrup, nasal saline, and decongestants. Your symptoms should improve over the next few days and resolve within 7-10 days. Increase rest and fluids. F/u if symptoms  worsen or predominate such as sore throat, ear pain, productive cough, shortness of breath, or if you develop high fevers or worsening fatigue over the next several days.    I will contact you if your COVID test is positive.  If positive will need to isolate for couple days and then wear mask x 5 days.  If you are COVID-negative, likely another viral illness and you should be eating better in the next 2 days.  Continue with your over-the-counter medicines.     ED Prescriptions   None    PDMP not reviewed this encounter.   Danton Clap, PA-C 08/06/22 1035

## 2022-08-05 NOTE — ED Triage Notes (Signed)
Pt c/o cough, loss of voice x4days  Pt states that from Wednesday night, symptoms have gotten worse.  Pt asks for a work note

## 2022-09-30 ENCOUNTER — Ambulatory Visit
Admission: EM | Admit: 2022-09-30 | Discharge: 2022-09-30 | Disposition: A | Discharge status: Home / Self Care | Payer: 59 | Attending: Physician Assistant | Admitting: Physician Assistant

## 2022-09-30 DIAGNOSIS — J069 Acute upper respiratory infection, unspecified: Secondary | ICD-10-CM | POA: Diagnosis not present

## 2022-09-30 DIAGNOSIS — J029 Acute pharyngitis, unspecified: Secondary | ICD-10-CM | POA: Diagnosis not present

## 2022-09-30 LAB — GROUP A STREP BY PCR: Group A Strep by PCR: NOT DETECTED

## 2022-09-30 MED ORDER — PROMETHAZINE-DM 6.25-15 MG/5ML PO SYRP: 5.0000 mL | ORAL_SOLUTION | Freq: Four times a day (QID) | ORAL | 0 refills | Status: DC | PRN

## 2022-09-30 MED ORDER — LIDOCAINE VISCOUS HCL 2 % MT SOLN: 15.0000 mL | OROMUCOSAL | 0 refills | Status: DC | PRN

## 2022-09-30 NOTE — ED Triage Notes (Signed)
Pt states that he has a cough, nasal congestion, and sore throat. X2 days

## 2022-09-30 NOTE — Discharge Instructions (Addendum)
URI/COLD SYMPTOMS: Negative strep. Your exam today is consistent with a viral illness. Antibiotics are not indicated at this time. Use medications as directed, including cough syrup, nasal saline, and decongestants. Your symptoms should improve over the next few days and resolve within 7-10 days. Increase rest and fluids. F/u if symptoms worsen or predominate such as sore throat, ear pain, productive cough, shortness of breath, or if you develop high fevers or worsening fatigue over the next several days.   

## 2022-09-30 NOTE — ED Provider Notes (Signed)
[facility]    CSN: [id number] Arrival date & time: 09/30/22  4098      History   Chief Complaint Chief Complaint  Patient presents with   Cough    Cough, nasal congestion and sore throat. X2 days    HPI Marcus Woods is a 37 y.o. male presenting for 2-day history of fatigue, cough, congestion and sore throat. Denies fever. He says that he feels better than he did at onset of symptoms. Denies COVID exposure and has history of COVID 2 months ago.  He has not had any wheezing or breathing difficulty, vomiting, diarrhea, abdominal pain, etc.  Taking OTC meds. No other complaints.  HPI  History reviewed. No pertinent past medical history.  There are no problems to display for this patient.   Past Surgical History:  Procedure Laterality Date   NO PAST SURGERIES         Home Medications    Prior to Admission medications   Medication Sig Start Date End Date Taking? Authorizing Provider  lidocaine (XYLOCAINE) 2 % solution Use as directed 15 mLs in the mouth or throat every 3 (three) hours as needed for mouth pain (swish and spit). 09/30/22  Yes Shirlee Latch, PA-C  promethazine-dextromethorphan (PROMETHAZINE-DM) 6.25-15 MG/5ML syrup Take 5 mLs by mouth 4 (four) times daily as needed. 09/30/22  Yes Eusebio Friendly B, PA-C  ibuprofen (ADVIL) 800 MG tablet Take 1 tablet (800 mg total) by mouth 3 (three) times daily. 06/05/22   Rodriguez-Southworth, Nettie Elm, PA-C    Family History History reviewed. No pertinent family history.  Social History Social History   Tobacco Use   Smoking status: Former   Smokeless tobacco: Never  Building services engineer Use: Never used  Substance Use Topics   Alcohol use: Not Currently   Drug use: Not Currently    Types: Marijuana     Allergies   Patient has no known allergies.   Review of Systems Review of Systems  Constitutional:  Positive for fatigue. Negative for fever.  HENT:  Positive for congestion, rhinorrhea and sore  throat. Negative for sinus pressure and sinus pain.   Respiratory:  Positive for cough. Negative for shortness of breath.   Gastrointestinal:  Negative for abdominal pain, diarrhea, nausea and vomiting.  Musculoskeletal:  Negative for myalgias.  Neurological:  Negative for weakness, light-headedness and headaches.  Hematological:  Negative for adenopathy.     Physical Exam Triage Vital Signs ED Triage Vitals [08/05/22 1259]  Enc Vitals Group     BP      Pulse      Resp      Temp      Temp src      SpO2      Weight 209 lb (94.8 kg)     Height  (1.753 m)     Head Circumference      Peak Flow      Pain Score 0     Pain Loc      Pain Edu?      Excl. in GC?    No data found.  Updated Vital Signs BP 119/87 (BP Location: Left Arm)   Pulse 76   Temp 98.8 F (37.1 C) (Oral)   Resp 19   Ht  (1.778 m)   Wt 211 lb (95.7 kg)   SpO2 97%   BMI 30.28 kg/m      Physical Exam Vitals and nursing note reviewed.  Constitutional:  General: He is not in acute distress.    Appearance: Normal appearance. He is well-developed. He is not ill-appearing.  HENT:     Head: Normocephalic and atraumatic.     Nose: Congestion present.     Mouth/Throat:     Mouth: Mucous membranes are moist.     Pharynx: Oropharynx is clear. Posterior oropharyngeal erythema present.  Eyes:     General: No scleral icterus.    Conjunctiva/sclera: Conjunctivae normal.  Cardiovascular:     Rate and Rhythm: Normal rate and regular rhythm.     Heart sounds: Normal heart sounds.  Pulmonary:     Effort: Pulmonary effort is normal. No respiratory distress.     Breath sounds: Normal breath sounds.  Musculoskeletal:     Cervical back: Neck supple.  Skin:    General: Skin is warm and dry.     Capillary Refill: Capillary refill takes less than 2 seconds.  Neurological:     General: No focal deficit present.     Mental Status: He is alert. Mental status is at baseline.     Motor: No weakness.      Gait: Gait normal.  Psychiatric:        Mood and Affect: Mood normal.        Behavior: Behavior normal.      UC Treatments / Results  Labs (all labs ordered are listed, but only abnormal results are displayed) Labs Reviewed  GROUP A STREP BY PCR    EKG   Radiology No results found.  Procedures Procedures (including critical care time)  Medications Ordered in UC Medications - No data to display  Initial Impression / Assessment and Plan / UC Course  I have reviewed the triage vital signs and the nursing notes.  Pertinent labs & imaging results that were available during my care of the patient were reviewed by me and considered in my medical decision making (see chart for details).   37 year old male presents for 2-day history of cough, congestion, fatigue, and sore throat.  Improvement in his symptoms with over-the-counter DayQuil/NyQuil.  Had COVID 2 months ago.  Vitals are normal and stable and he is overall well-appearing.  On exam he has nasal congestion, mild posterior pharyngeal erythema with large amount of clear to light yellow postnasal drainage.  Chest clear to auscultation.  PCR strep obtained. Negative. Discussed with patient.    Viral URI. Supportive care encouraged taking promethazine DM for cough and viscous lidocaine as needed and return for any acute worsening of symptoms.  Final Clinical Impressions(s) / UC Diagnoses   Final diagnoses:  Viral URI with cough  Sore throat     Discharge Instructions      URI/COLD SYMPTOMS: Negative strep. Your exam today is consistent with a viral illness. Antibiotics are not indicated at this time. Use medications as directed, including cough syrup, nasal saline, and decongestants. Your symptoms should improve over the next few days and resolve within 7-10 days. Increase rest and fluids. F/u if symptoms worsen or predominate such as sore throat, ear pain, productive cough, shortness of breath, or if you develop high  fevers or worsening fatigue over the next several days.         ED Prescriptions     Medication Sig Dispense Auth. Provider   promethazine-dextromethorphan (PROMETHAZINE-DM) 6.25-15 MG/5ML syrup Take 5 mLs by mouth 4 (four) times daily as needed. 118 mL Eusebio Friendly B, PA-C   lidocaine (XYLOCAINE) 2 % solution Use as directed 15 mLs in  the mouth or throat every 3 (three) hours as needed for mouth pain (swish and spit). 100 mL Shirlee Latch, PA-C      PDMP not reviewed this encounter.      Shirlee Latch, PA-C 09/30/22 1123

## 2022-10-03 ENCOUNTER — Ambulatory Visit
Admission: EM | Admit: 2022-10-03 | Discharge: 2022-10-03 | Disposition: A | Discharge status: Home / Self Care | Payer: 59 | Attending: Internal Medicine | Admitting: Internal Medicine

## 2022-10-03 ENCOUNTER — Ambulatory Visit (INDEPENDENT_AMBULATORY_CARE_PROVIDER_SITE_OTHER): Payer: 59

## 2022-10-03 ENCOUNTER — Other Ambulatory Visit: Payer: Self-pay

## 2022-10-03 DIAGNOSIS — J069 Acute upper respiratory infection, unspecified: Secondary | ICD-10-CM

## 2022-10-03 DIAGNOSIS — Z1152 Encounter for screening for COVID-19: Secondary | ICD-10-CM | POA: Insufficient documentation

## 2022-10-03 DIAGNOSIS — R059 Cough, unspecified: Secondary | ICD-10-CM | POA: Diagnosis not present

## 2022-10-03 DIAGNOSIS — R0982 Postnasal drip: Secondary | ICD-10-CM | POA: Diagnosis not present

## 2022-10-03 DIAGNOSIS — J9801 Acute bronchospasm: Secondary | ICD-10-CM | POA: Diagnosis not present

## 2022-10-03 LAB — SARS CORONAVIRUS 2 BY RT PCR: SARS Coronavirus 2 by RT PCR: NEGATIVE

## 2022-10-03 MED ORDER — FEXOFENADINE HCL 180 MG PO TABS: 180.0000 mg | ORAL_TABLET | Freq: Every day | ORAL | 0 refills | Status: DC

## 2022-10-03 MED ORDER — ALBUTEROL SULFATE HFA 108 (90 BASE) MCG/ACT IN AERS: 2.0000 | INHALATION_SPRAY | RESPIRATORY_TRACT | 0 refills | Status: DC | PRN

## 2022-10-03 MED ORDER — ALBUTEROL SULFATE (2.5 MG/3ML) 0.083% IN NEBU
2.5000 mg | INHALATION_SOLUTION | Freq: Once | RESPIRATORY_TRACT | Status: AC
  Administered 2022-10-03: 2.5 mg via RESPIRATORY_TRACT

## 2022-10-03 MED ORDER — BENZONATATE 200 MG PO CAPS: 200.0000 mg | ORAL_CAPSULE | Freq: Three times a day (TID) | ORAL | 0 refills | Status: DC | PRN

## 2022-10-03 MED ORDER — PREDNISONE 20 MG PO TABS: 20.0000 mg | ORAL_TABLET | Freq: Every day | ORAL | 0 refills | Status: DC

## 2022-10-03 NOTE — ED Provider Notes (Signed)
[facility]    CSN: [id number] Arrival date & time: 10/03/22  1351      History   Chief Complaint Chief Complaint  Patient presents with   Cough   Nasal Congestion    HPI Marcus Woods is a 37 y.o. male who presents due to his cough getting worse at night. Was here and diagnosed with URI 3 days ago and ran out of her rx cough med. He tried Nyquil last night and did not help his cough. Has had mild wheezing. His cough is productive with mostly white sputum, but sometimes has yellow mucous production. Denies fever, chills or sweats. He has never smoked. Has been fatigued and not been hungry today.     History reviewed. No pertinent past medical history.  There are no problems to display for this patient.   Past Surgical History:  Procedure Laterality Date   NO PAST SURGERIES         Home Medications    Prior to Admission medications   Medication Sig Start Date End Date Taking? Authorizing Provider  albuterol (VENTOLIN HFA) 108 (90 Base) MCG/ACT inhaler Inhale 2 puffs into the lungs every 4 (four) hours as needed. 10/03/22  Yes Rodriguez-Southworth, Nettie Elm, PA-C  benzonatate (TESSALON) 200 MG capsule Take 1 capsule (200 mg total) by mouth 3 (three) times daily as needed. 10/03/22  Yes Rodriguez-Southworth, Nettie Elm, PA-C  fexofenadine (ALLEGRA ALLERGY) 180 MG tablet Take 1 tablet (180 mg total) by mouth daily. 10/03/22  Yes Rodriguez-Southworth, Nettie Elm, PA-C  predniSONE (DELTASONE) 20 MG tablet Take 1 tablet (20 mg total) by mouth daily with breakfast. 10/03/22  Yes Rodriguez-Southworth, Nettie Elm, PA-C  ibuprofen (ADVIL) 800 MG tablet Take 1 tablet (800 mg total) by mouth 3 (three) times daily. 06/05/22   Rodriguez-Southworth, Nettie Elm, PA-C  lidocaine (XYLOCAINE) 2 % solution Use as directed 15 mLs in the mouth or throat every 3 (three) hours as needed for mouth pain (swish and spit). 09/30/22   Shirlee Latch, PA-C  promethazine-dextromethorphan (PROMETHAZINE-DM) 6.25-15  MG/5ML syrup Take 5 mLs by mouth 4 (four) times daily as needed. 09/30/22   Shirlee Latch PA-C    Family History History reviewed. No pertinent family history.  Social History Social History   Tobacco Use   Smoking status: Former   Smokeless tobacco: Never  Building services engineer Use: Never used  Substance Use Topics   Alcohol use: Not Currently   Drug use: Not Currently    Types: Marijuana     Allergies   Patient has no known allergies.   Review of Systems Review of Systems As noted in HPI  Physical Exam Triage Vital Signs ED Triage Vitals  Enc Vitals Group     BP 10/03/22 1403 123/82     Pulse Rate 10/03/22 1403 96     Resp 10/03/22 1403 20     Temp 10/03/22 1403 99 F (37.2 C)     Temp Source 10/03/22 1403 Oral     SpO2 10/03/22 1403 99 %     Weight --      Height --      Head Circumference --      Peak Flow --      Pain Score 10/03/22 1400 0     Pain Loc --      Pain Edu? --      Excl. in GC? --    No data found.  Updated Vital Signs BP 123/82   Pulse 96   Temp  99 F (37.2 C) (Oral)   Resp 20   SpO2 99%   Visual Acuity Right Eye Distance:   Left Eye Distance:   Bilateral Distance:    Right Eye Near:   Left Eye Near:    Bilateral Near:     Physical Exam Physical Exam Vitals signs and nursing note reviewed.  Constitutional:      General: he is not in acute distress.    Appearance: Normal appearance. He is not ill-appearing, toxic-appearing or diaphoretic.  HENT:     Head: Normocephalic.     Right Ear: Tympanic membrane, ear canal and external ear normal.     Left Ear: Tympanic membrane, ear canal and external ear normal.     Nose: Nose normal.     Mouth/Throat: clear    Mouth: Mucous membranes are moist.  Eyes:     General: No scleral icterus.       Right eye: No discharge.        Left eye: No discharge.     Conjunctiva/sclera: Conjunctivae normal.  Neck:     Musculoskeletal: Neck supple. No neck rigidity.  Cardiovascular:      Rate and Rhythm: Normal rate and regular rhythm.     Heart sounds: No murmur.  Pulmonary:     Effort: Pulmonary effort is normal.     Breath sounds: Normal breath sounds.   Musculoskeletal: Normal range of motion.  Lymphadenopathy:     Cervical: No cervical adenopathy.  Skin:    General: Skin is warm and dry.     Coloration: Skin is not jaundiced.     Findings: No rash.  Neurological:     Mental Status: She is alert and oriented to person, place, and time.     Gait: Gait normal.  Psychiatric:        Mood and Affect: Mood normal.        Behavior: Behavior normal.        Thought Content: Thought content normal.        Judgment: Judgment normal.    UC Treatments / Results  Labs (all labs ordered are listed, but only abnormal results are displayed) Labs Reviewed  SARS CORONAVIRUS 2 BY RT PCR    EKG   Radiology DG Chest 2 View  Result Date: 10/03/2022 CLINICAL DATA:  Productive cough. EXAM: CHEST - 2 VIEW COMPARISON:  03/10/2021 FINDINGS: The cardiomediastinal contours are normal. Minor subsegmental atelectasis in the right middle lobe. Pulmonary vasculature is normal. No consolidation, pleural effusion, or pneumothorax. No acute osseous abnormalities are seen. IMPRESSION: Minor subsegmental atelectasis in the right middle lobe. No evidence of pneumonia. Electronically Signed   By: Narda Rutherford M.D.   On: 10/03/2022 14:54    Procedures Procedures (including critical care time)  Medications Ordered in UC Medications  albuterol (PROVENTIL) (2.5 MG/3ML) 0.083% nebulizer solution 2.5 mg (2.5 mg Nebulization Given 10/03/22 1525)    Initial Impression / Assessment and Plan / UC Course  I have reviewed the triage vital signs and the nursing notes. He was given an albuterol neg and this helped his cough attacks.  Pertinent  imaging results that were available during my care of the patient were reviewed by me and considered in my medical decision making (see chart for  details).  URI with cough Bronchospasms  He was placed on Tessalon, Albuterol, Prednisone and Allegra as noted We will call him if the Covid test is positive. See instructions.     Final Clinical Impressions(s) / UC  Diagnoses   Final diagnoses:  Bronchospasm  Post-nasal drainage  Viral URI with cough     Discharge Instructions      You have a cold caused by a virus and antibiotics do not help this. It takes up to 2 weeks for this to resolve and normally starts improving after 10 days. All the cold medications are just to help symptoms, not cure it We will call you when the covid test is back if positive.  The prednisone and albuterol and tessalon perless is for the cough and inflammation of your bronchial tubes tat are causing you to cough. The Allegra is to help with the post nasal drainge you are having.      ED Prescriptions     Medication Sig Dispense Auth. Provider   albuterol (VENTOLIN HFA) 108 (90 Base) MCG/ACT inhaler Inhale 2 puffs into the lungs every 4 (four) hours as needed. 18 g Rodriguez-Southworth, Nettie Elm, PA-C   predniSONE (DELTASONE) 20 MG tablet Take 1 tablet (20 mg total) by mouth daily with breakfast. 5 tablet Rodriguez-Southworth, Nettie Elm, PA-C   fexofenadine (ALLEGRA ALLERGY) 180 MG tablet Take 1 tablet (180 mg total) by mouth daily. 14 tablet Rodriguez-Southworth, Ostin Mathey, PA-C   benzonatate (TESSALON) 200 MG capsule Take 1 capsule (200 mg total) by mouth 3 (three) times daily as needed. 30 capsule Rodriguez-Southworth, Nettie Elm, [place]      I have reviewed the PDMP during this encounter.   Garey Ham, PA-C 10/03/22 1607

## 2022-10-03 NOTE — ED Triage Notes (Addendum)
Productive Cough since Saturday and dx with an infection. Pt states he's not sure of what the infection was but was given medication for it. Medication was working and now he is out of the medication and cough is getting worse. Denies fevers.

## 2022-10-03 NOTE — Discharge Instructions (Addendum)
You have a cold caused by a virus and antibiotics do not help this. It takes up to 2 weeks for this to resolve and normally starts improving after 10 days. All the cold medications are just to help symptoms, not cure it We will call you when the covid test is back if positive.  The prednisone and albuterol and tessalon perless is for the cough and inflammation of your bronchial tubes tat are causing you to cough. The Allegra is to help with the post nasal drainge you are having.

## 2023-02-23 ENCOUNTER — Ambulatory Visit
Admission: EM | Admit: 2023-02-23 | Discharge: 2023-02-23 | Disposition: A | Discharge status: Home / Self Care | Payer: 59 | Attending: Family Medicine | Admitting: Family Medicine

## 2023-02-23 ENCOUNTER — Encounter: Payer: Self-pay | Admitting: Emergency Medicine

## 2023-02-23 DIAGNOSIS — B349 Viral infection, unspecified: Secondary | ICD-10-CM | POA: Diagnosis present

## 2023-02-23 DIAGNOSIS — Z20822 Contact with and (suspected) exposure to covid-19: Secondary | ICD-10-CM | POA: Diagnosis present

## 2023-02-23 LAB — SARS CORONAVIRUS 2 BY RT PCR: SARS Coronavirus 2 by RT PCR: NEGATIVE

## 2023-02-23 LAB — GROUP A STREP BY PCR: Group A Strep by PCR: NOT DETECTED

## 2023-02-23 NOTE — ED Triage Notes (Signed)
Patient c/o cough, sore throat, and diarrhea that started yesterday.  Patient unsure of fevers.  Patient needs a work note.

## 2023-02-23 NOTE — ED Provider Notes (Signed)
[facility]    CSN: [id number] Arrival date & time: 02/23/23  0912      History   Chief Complaint Chief Complaint  Patient presents with   Sore Throat   Cough   Diarrhea    HPI Marcus Woods is a 37 y.o. male.   HPI  History obtained from the patient. Marcus Woods presents for sore throat, diarrhea and cough that started yesterday. Had 1 episode of non-blood diarrhea this morning.  No known fevers. No vomiting, rash, abdominal pain. Endorses headache. Uses some Halls cough drops. Some people around him have similar sx.     History reviewed. No pertinent past medical history.  There are no problems to display for this patient.   Past Surgical History:  Procedure Laterality Date   NO PAST SURGERIES         Home Medications    Prior to Admission medications   Medication Sig Start Date End Date Taking? Authorizing Provider  albuterol (VENTOLIN HFA) 108 (90 Base) MCG/ACT inhaler Inhale 2 puffs into the lungs every 4 (four) hours as needed. 10/03/22   Rodriguez-Southworth, Nettie Elm, PA-C  benzonatate (TESSALON) 200 MG capsule Take 1 capsule (200 mg total) by mouth 3 (three) times daily as needed. 10/03/22   Rodriguez-Southworth, Nettie Elm, PA-C  fexofenadine (ALLEGRA ALLERGY) 180 MG tablet Take 1 tablet (180 mg total) by mouth daily. 10/03/22   Rodriguez-Southworth, Nettie Elm, PA-C  ibuprofen (ADVIL) 800 MG tablet Take 1 tablet (800 mg total) by mouth 3 (three) times daily. 06/05/22   Rodriguez-Southworth, Nettie Elm, PA-C  lidocaine (XYLOCAINE) 2 % solution Use as directed 15 mLs in the mouth or throat every 3 (three) hours as needed for mouth pain (swish and spit). 09/30/22   Shirlee Latch, PA-C  predniSONE (DELTASONE) 20 MG tablet Take 1 tablet (20 mg total) by mouth daily with breakfast. 10/03/22   Rodriguez-Southworth, Nettie Elm, PA-C  promethazine-dextromethorphan (PROMETHAZINE-DM) 6.25-15 MG/5ML syrup Take 5 mLs by mouth 4 (four) times daily as needed. 09/30/22   Shirlee Latch  PA-C    Family History History reviewed. No pertinent family history.  Social History Social History   Tobacco Use   Smoking status: Former   Smokeless tobacco: Never  Advertising account planner   Vaping status: Never Used  Substance Use Topics   Alcohol use: Not Currently   Drug use: Not Currently    Types: Marijuana     Allergies   Patient has no known allergies.   Review of Systems Review of Systems: negative unless otherwise stated in HPI.      Physical Exam Triage Vital Signs ED Triage Vitals  Encounter Vitals Group     BP 02/23/23 1006 123/76     Systolic BP Percentile --      Diastolic BP Percentile --      Pulse Rate 02/23/23 1006 66     Resp 02/23/23 1006 15     Temp 02/23/23 1006 98.8 F (37.1 C)     Temp Source 02/23/23 1006 Oral     SpO2 02/23/23 1006 98 %     Weight 02/23/23 1004 210 lb 15.7 oz (95.7 kg)     Height 02/23/23 1004 5\' 10"  (1.778 m)     Head Circumference --      Peak Flow --      Pain Score 02/23/23 1004 6     Pain Loc --      Pain Education --      Exclude from Growth Chart --  No data found.  Updated Vital Signs BP 123/76 (BP Location: Right Arm)   Pulse 66   Temp 98.8 F (37.1 C) (Oral)   Resp 15   Ht 5\' 10"  (1.778 m)   Wt 95.7 kg   SpO2 98%   BMI 30.27 kg/m   Visual Acuity Right Eye Distance:   Left Eye Distance:   Bilateral Distance:    Right Eye Near:   Left Eye Near:    Bilateral Near:     Physical Exam GEN:     alert, non-ill appearing male in no distress    HENT:  mucus membranes moist, oropharyngeal without lesions or erythema, no tonsillar hypertrophy or exudates, no nasal discharge EYES:   pupils equal and reactive, no scleral injection or discharge NECK:  normal ROM, no meningismus   RESP:  no increased work of breathing, clear to auscultation bilaterally CVS:   regular rate and rhythm ABD:   Soft, nontender, nondistended, no guarding, no rebound, active bowel sounds throughout Skin:   warm and dry, no rash  on visible skin    UC Treatments / Results  Labs (all labs ordered are listed, but only abnormal results are displayed) Labs Reviewed  GROUP A STREP BY PCR  SARS CORONAVIRUS 2 BY RT PCR    EKG   Radiology No results found.  Procedures Procedures (including critical care time)  Medications Ordered in UC Medications - No data to display  Initial Impression / Assessment and Plan / UC Course  I have reviewed the triage vital signs and the nursing notes.  Pertinent labs & imaging results that were available during my care of the patient were reviewed by me and considered in my medical decision making (see chart for details).       Pt is a 37 y.o. male who presents for  1 day of respiratory and diarrhea symptoms. Marcus Woods is afebrile here without recent antipyretics. Satting well on room air. Overall pt is non-ill appearing, well hydrated, without respiratory distress. Pulmonary exam is unremarkable.  Strep and COVID testing obtained and were negative. History consistent with viral respiratory illness. Discussed symptomatic treatment.  Explained lack of efficacy of antibiotics in viral disease.  Typical duration of symptoms discussed.   Return and ED precautions given and voiced understanding. Discussed MDM, treatment plan and plan for follow-up with patient who agrees with plan.     Final Clinical Impressions(s) / UC Diagnoses   Final diagnoses:  Viral illness  Encounter for laboratory testing for COVID-19 virus     Discharge Instructions      Your strep test and COVID tests are negative.  You can take Tylenol and/or Ibuprofen as needed for fever reduction and pain relief.    For cough: honey 1/2 to 1 teaspoon (you can dilute the honey in water or another fluid).  You can also use guaifenesin and dextromethorphan for cough. You can use a humidifier for chest congestion and cough.  If you don't have a humidifier, you can sit in the bathroom with the hot shower running.       For sore throat: try warm salt water gargles, Mucinex sore throat cough drops or cepacol lozenges, throat spray, warm tea or water with lemon/honey, popsicles or ice, or OTC cold relief medicine for throat discomfort. You can also purchase chloraseptic spray at the pharmacy or dollar store.   For congestion: take a daily anti-histamine like Zyrtec, Claritin, and a oral decongestant, such as pseudoephedrine.  You can also use  Flonase 1-2 sprays in each nostril daily. Afrin is also a good option, if you do not have high blood pressure.    It is important to stay hydrated: drink plenty of fluids (water, gatorade/powerade/pedialyte, juices, or teas) to keep your throat moisturized and help further relieve irritation/discomfort.    Return or go to the Emergency Department if symptoms worsen or do not improve in the next few days      ED Prescriptions   None    PDMP not reviewed this encounter.   Katha Cabal, DO 02/23/23 1056

## 2023-02-23 NOTE — Discharge Instructions (Signed)
Your strep test and COVID tests are negative.  You can take Tylenol and/or Ibuprofen as needed for fever reduction and pain relief.    For cough: honey 1/2 to 1 teaspoon (you can dilute the honey in water or another fluid).  You can also use guaifenesin and dextromethorphan for cough. You can use a humidifier for chest congestion and cough.  If you don't have a humidifier, you can sit in the bathroom with the hot shower running.      For sore throat: try warm salt water gargles, Mucinex sore throat cough drops or cepacol lozenges, throat spray, warm tea or water with lemon/honey, popsicles or ice, or OTC cold relief medicine for throat discomfort. You can also purchase chloraseptic spray at the pharmacy or dollar store.   For congestion: take a daily anti-histamine like Zyrtec, Claritin, and a oral decongestant, such as pseudoephedrine.  You can also use Flonase 1-2 sprays in each nostril daily. Afrin is also a good option, if you do not have high blood pressure.    It is important to stay hydrated: drink plenty of fluids (water, gatorade/powerade/pedialyte, juices, or teas) to keep your throat moisturized and help further relieve irritation/discomfort.    Return or go to the Emergency Department if symptoms worsen or do not improve in the next few days

## 2023-03-26 ENCOUNTER — Ambulatory Visit
Admission: EM | Admit: 2023-03-26 | Discharge: 2023-03-26 | Disposition: A | Discharge status: Home / Self Care | Payer: 59 | Attending: Emergency Medicine | Admitting: Emergency Medicine

## 2023-03-26 DIAGNOSIS — M542 Cervicalgia: Secondary | ICD-10-CM | POA: Diagnosis not present

## 2023-03-26 DIAGNOSIS — T148XXA Other injury of unspecified body region, initial encounter: Secondary | ICD-10-CM

## 2023-03-26 DIAGNOSIS — M62830 Muscle spasm of back: Secondary | ICD-10-CM

## 2023-03-26 DIAGNOSIS — M436 Torticollis: Secondary | ICD-10-CM | POA: Diagnosis not present

## 2023-03-26 MED ORDER — KETOROLAC TROMETHAMINE 30 MG/ML IJ SOLN: 30.0000 mg | Freq: Once | INTRAMUSCULAR | Status: DC

## 2023-03-26 MED ORDER — KETOROLAC TROMETHAMINE 30 MG/ML IJ SOLN
30.0000 mg | Freq: Once | INTRAMUSCULAR | Status: AC
  Administered 2023-03-26: 30 mg via INTRAMUSCULAR

## 2023-03-26 MED ORDER — CYCLOBENZAPRINE HCL 10 MG PO TABS: 10.0000 mg | ORAL_TABLET | Freq: Three times a day (TID) | ORAL | 0 refills | Status: AC | PRN

## 2023-03-26 NOTE — Discharge Instructions (Addendum)
Take home meds as directed.  Take Flexeril as directed May use heat or ice to back for comfort 20 min 3 x daily.  May use lidocaine patch or biofreeze for pain.  GO immediately to nearest ER or call 9-1-1 for loss of bowel and bladder,loss of function, saddle numbness, etc.  Pt advised otc tylenol as label directed for pain Avoid lifting,turning,bending as this will aggravate your back  Please get established with PCP of your choice for general medical issues, have referred you to [facility].Please follow up with PCP, may need to referral to physical therapy for further back pain management.   [facility]: [address] [place], [place] [address] Phone: [phone] Urgent care hours 8a-7:30p Mon-Sat

## 2023-03-26 NOTE — ED Triage Notes (Signed)
Patient states that he thinks he slept wrong last week. He's now having pain down his back and right shoulder with neck pain. He's tried lidocaine patches. He states that it gets worse at night.

## 2023-03-26 NOTE — ED Provider Notes (Signed)
[facility]    CSN: [id number] Arrival date & time: 03/26/23  0955      History   Chief Complaint Chief Complaint  Patient presents with   Back Pain    HPI Marcus Woods is a 37 y.o. male.   37 year old male patient, Marcus Woods, presents to urgent care for evaluation of right-sided upper back/shoulder and neck pain.  Patient states he went to a friend's house last Thursday and slept with his neck in a weird position on a board and a pillow, since then he has had neck and shoulder pain.  Patient states he has tried topical gel without relief.  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 No blunt force trauma.  The history is provided by the patient. No language interpreter was used.    History reviewed. No pertinent past medical history.  Patient Active Problem List   Diagnosis Date Noted   Muscle strain 03/26/2023   Muscle spasm of back 03/26/2023   Cervical pain (neck) 03/26/2023   Torticollis, acute 03/26/2023    Past Surgical History:  Procedure Laterality Date   NO PAST SURGERIES         Home Medications    Prior to Admission medications   Medication Sig Start Date End Date Taking? Authorizing Provider  cyclobenzaprine (FLEXERIL) 10 MG tablet Take 1 tablet (10 mg total) by mouth 3 (three) times daily as needed for up to 5 days for muscle spasms. 03/26/23 03/31/23 Yes Teleshia Lemere, Para March, NP  albuterol (VENTOLIN HFA) 108 (90 Base) MCG/ACT inhaler Inhale 2 puffs into the lungs every 4 (four) hours as needed. 10/03/22   Rodriguez-Southworth, Nettie Elm, PA-C  benzonatate (TESSALON) 200 MG capsule Take 1 capsule (200 mg total) by mouth 3 (three) times daily as needed. 10/03/22   Rodriguez-Southworth, Nettie Elm, PA-C  fexofenadine (ALLEGRA ALLERGY) 180 MG tablet Take 1 tablet (180 mg total) by mouth daily. 10/03/22   Rodriguez-Southworth, Nettie Elm, PA-C  ibuprofen (ADVIL) 800 MG tablet Take 1 tablet (800 mg total) by mouth 3 (three) times daily.  06/05/22   Rodriguez-Southworth, Nettie Elm, PA-C  lidocaine (XYLOCAINE) 2 % solution Use as directed 15 mLs in the mouth or throat every 3 (three) hours as needed for mouth pain (swish and spit). 09/30/22   Shirlee Latch, PA-C  predniSONE (DELTASONE) 20 MG tablet Take 1 tablet (20 mg total) by mouth daily with breakfast. 10/03/22   Rodriguez-Southworth, Nettie Elm, PA-C  promethazine-dextromethorphan (PROMETHAZINE-DM) 6.25-15 MG/5ML syrup Take 5 mLs by mouth 4 (four) times daily as needed. 09/30/22   Shirlee Latch PA-C    Family History History reviewed. No pertinent family history.  Social History Social History   Tobacco Use   Smoking status: Former   Smokeless tobacco: Never  Advertising account planner   Vaping status: Never Used  Substance Use Topics   Alcohol use: Not Currently   Drug use: Not Currently    Types: Marijuana     Allergies   Patient has no known allergies.   Review of Systems Review of Systems  Constitutional:  Negative for fever.  Musculoskeletal:  Positive for back pain, neck pain and neck stiffness.  Skin: Negative.   Neurological:  Negative for weakness and numbness.  Psychiatric/Behavioral:  Positive for behavioral problems.   All other systems reviewed and are negative.    Physical Exam Triage Vital Signs ED Triage Vitals [03/26/23 1005]  Encounter Vitals Group     BP 132/86     Systolic BP Percentile  Diastolic BP Percentile      Pulse Rate 67     Resp 19     Temp 97.7 F (36.5 C)     Temp Source Oral     SpO2 98 %     Weight      Height      Head Circumference      Peak Flow      Pain Score 8     Pain Loc      Pain Education      Exclude from Growth Chart    No data found.  Updated Vital Signs BP 132/86 (BP Location: Left Arm)   Pulse 67   Temp 97.7 F (36.5 C) (Oral)   Resp 19   SpO2 98%   Visual Acuity Right Eye Distance:   Left Eye Distance:   Bilateral Distance:    Right Eye Near:   Left Eye Near:    Bilateral Near:      Physical Exam Vitals and nursing note reviewed.  Constitutional:      Appearance: He is well-developed and well-groomed.  HENT:     Head: Normocephalic.  Cardiovascular:     Rate and Rhythm: Normal rate and regular rhythm.     Pulses: Normal pulses.     Heart sounds: Normal heart sounds.  Pulmonary:     Effort: Pulmonary effort is normal.  Musculoskeletal:     Right shoulder: Tenderness present.     Cervical back: Pain with movement and muscular tenderness present. No spinous process tenderness.     Comments: +trapezius muscle spasm tenderness with palpation/movement 5/5  Lymphadenopathy:     Cervical: No cervical adenopathy.  Neurological:     General: No focal deficit present.     Mental Status: He is alert and oriented to person, place, and time.     GCS: GCS eye subscore is 4. GCS verbal subscore is 5. GCS motor subscore is 6.     Cranial Nerves: No cranial nerve deficit.     Sensory: No sensory deficit.  Psychiatric:        Attention and Perception: Attention normal.        Mood and Affect: Mood normal.        Speech: Speech normal.        Behavior: Behavior normal. Behavior is cooperative.      UC Treatments / Results  Labs (all labs ordered are listed, but only abnormal results are displayed) Labs Reviewed - No data to display  EKG   Radiology No results found.  Procedures Procedures (including critical care time)  Medications Ordered in UC Medications  ketorolac (TORADOL) 30 MG/ML injection 30 mg (30 mg Intramuscular Given 03/26/23 1048)    Initial Impression / Assessment and Plan / UC Course  I have reviewed the triage vital signs and the nursing notes.  Pertinent labs & imaging results that were available during my care of the patient were reviewed by me and considered in my medical decision making (see chart for details).  Clinical Course as of 03/26/23 1400  Mon Mar 26, 2023  1049 Toradol 30 mg Im for pain, discussed plan of care with pt, pt  verbalized understanding to this provider. [JD]    Clinical Course User Index [JD] Marieke Lubke, Para March, NP    Ddx: Torticollis, muscle spasm, muscle strain, neck pain Final Clinical Impressions(s) / UC Diagnoses   Final diagnoses:  Muscle strain  Muscle spasm of back  Cervical pain (neck)  Torticollis, acute  Discharge Instructions      Take home meds as directed.  Take Flexeril as directed May use heat or ice to back for comfort 20 min 3 x daily.  May use lidocaine patch or biofreeze for pain.  GO immediately to nearest ER or call 9-1-1 for loss of bowel and bladder,loss of function, saddle numbness, etc.  Pt advised otc tylenol as label directed for pain Avoid lifting,turning,bending as this will aggravate your back  Please get established with PCP of your choice for general medical issues, have referred you to [facility]Please follow up with PCP, may need to referral to physical therapy for further back pain management.   [facility]: [address] [place], [place] [address] Phone: [phone] Urgent care hours 8a-7:30p Mon-Sat     ED Prescriptions     Medication Sig Dispense Auth. Provider   cyclobenzaprine (FLEXERIL) 10 MG tablet Take 1 tablet (10 mg total) by mouth 3 (three) times daily as needed for up to 5 days for muscle spasms. 15 tablet Ziara Thelander, Para March, NP      PDMP not reviewed this encounter.   Clancy Gourd, NP 03/26/23 1400

## 2023-06-24 ENCOUNTER — Encounter: Payer: Self-pay | Admitting: Emergency Medicine

## 2023-06-24 ENCOUNTER — Ambulatory Visit (INDEPENDENT_AMBULATORY_CARE_PROVIDER_SITE_OTHER): Payer: 59

## 2023-06-24 ENCOUNTER — Ambulatory Visit
Admission: EM | Admit: 2023-06-24 | Discharge: 2023-06-24 | Disposition: A | Discharge status: Home / Self Care | Payer: 59 | Attending: Physician Assistant | Admitting: Physician Assistant

## 2023-06-24 DIAGNOSIS — R052 Subacute cough: Secondary | ICD-10-CM

## 2023-06-24 DIAGNOSIS — R0789 Other chest pain: Secondary | ICD-10-CM | POA: Diagnosis not present

## 2023-06-24 DIAGNOSIS — J209 Acute bronchitis, unspecified: Secondary | ICD-10-CM | POA: Diagnosis not present

## 2023-06-24 MED ORDER — PREDNISONE 10 MG PO TABS: ORAL_TABLET | ORAL | 0 refills | Status: DC

## 2023-06-24 MED ORDER — PROMETHAZINE-DM 6.25-15 MG/5ML PO SYRP: 5.0000 mL | ORAL_SOLUTION | Freq: Four times a day (QID) | ORAL | 0 refills | Status: DC | PRN

## 2023-06-24 NOTE — ED Triage Notes (Signed)
Patient c/o cough and chest congestion for a month.  Patient denies fevers.  

## 2023-06-24 NOTE — Discharge Instructions (Addendum)
-  Your x-ray is normal.  You do not have pneumonia or fluid in your lungs. - You have a chest cold. - I sent a corticosteroid and cough medicine to the pharmacy.  As discussed, chest colds are generally viral and can last for 2 to 6 weeks.  It may take another week or 2 for your symptoms to resolve.  If you develop a fever, increased chest pain or shortness of breath please return for reevaluation.

## 2023-06-24 NOTE — ED Provider Notes (Signed)
 [facility]    CSN: [id number] Arrival date & time: 06/24/23  0931      History   Chief Complaint Chief Complaint  Patient presents with   Cough    HPI Marcus Woods is a 38 y.o. male presenting for 1 month history of fatigue, cough, and chest congestion. Denies fever, sore throat or sinus pain. Reports chest tightness. He has not had any wheezing or breathing difficulty, vomiting, diarrhea, abdominal pain, etc.  Taking OTC meds. Symptoms have improved from onset. No other complaints.  HPI  History reviewed. No pertinent past medical history.  Patient Active Problem List   Diagnosis Date Noted   Muscle strain 03/26/2023   Muscle spasm of back 03/26/2023   Cervical pain (neck) 03/26/2023   Torticollis, acute 03/26/2023    Past Surgical History:  Procedure Laterality Date   NO PAST SURGERIES         Home Medications    Prior to Admission medications   Medication Sig Start Date End Date Taking? Authorizing Provider  predniSONE  (DELTASONE ) 10 MG tablet Take 6 tabs p.o. on day 1 and decrease by 1 tablet daily until complete 06/24/23  Yes Arvis Huxley B, PA-C  promethazine -dextromethorphan (PROMETHAZINE -DM) 6.25-15 MG/5ML syrup Take 5 mLs by mouth 4 (four) times daily as needed. 06/24/23  Yes Arvis Huxley NOVAK PA-C    Family History History reviewed. No pertinent family history.  Social History Social History   Tobacco Use   Smoking status: Former   Smokeless tobacco: Never  Advertising Account Planner   Vaping status: Never Used  Substance Use Topics   Alcohol use: Not Currently   Drug use: Not Currently    Types: Marijuana     Allergies   Patient has no known allergies.   Review of Systems Review of Systems  Constitutional:  Positive for fatigue. Negative for fever.  HENT:  Positive for congestion. Negative for rhinorrhea, sinus pressure, sinus pain and sore throat.   Respiratory:  Positive for cough and chest tightness. Negative for shortness of breath.    Cardiovascular:  Negative for chest pain.  Gastrointestinal:  Negative for abdominal pain, diarrhea, nausea and vomiting.  Musculoskeletal:  Negative for myalgias.  Neurological:  Negative for weakness, light-headedness and headaches.  Hematological:  Negative for adenopathy.     Physical Exam Triage Vital Signs ED Triage Vitals [08/05/22 1259]  Enc Vitals Group     BP      Pulse      Resp      Temp      Temp src      SpO2      Weight 209 lb (94.8 kg)     Height 5' 9 (1.753 m)     Head Circumference      Peak Flow      Pain Score 0     Pain Loc      Pain Edu?      Excl. in GC?    No data found.  Updated Vital Signs BP 134/86 (BP Location: Left Arm)   Pulse 99   Temp 98.4 F (36.9 C) (Oral)   Resp 20   Ht 5' 10 (1.778 m)   Wt 210 lb 15.7 oz (95.7 kg)   SpO2 100%   BMI 30.27 kg/m      Physical Exam Vitals and nursing note reviewed.  Constitutional:      General: He is not in acute distress.    Appearance: Normal appearance. He is well-developed. He is  not ill-appearing.  HENT:     Head: Normocephalic and atraumatic.     Nose: Congestion present.     Mouth/Throat:     Mouth: Mucous membranes are moist.     Pharynx: Oropharynx is clear. No posterior oropharyngeal erythema.  Eyes:     General: No scleral icterus.    Conjunctiva/sclera: Conjunctivae normal.  Cardiovascular:     Rate and Rhythm: Normal rate and regular rhythm.     Heart sounds: Normal heart sounds.  Pulmonary:     Effort: Pulmonary effort is normal. No respiratory distress.     Breath sounds: Rhonchi present.  Musculoskeletal:     Cervical back: Neck supple.  Skin:    General: Skin is warm and dry.     Capillary Refill: Capillary refill takes less than 2 seconds.  Neurological:     General: No focal deficit present.     Mental Status: He is alert. Mental status is at baseline.     Motor: No weakness.     Gait: Gait normal.  Psychiatric:        Mood and Affect: Mood normal.         Behavior: Behavior normal.      UC Treatments / Results  Labs (all labs ordered are listed, but only abnormal results are displayed) Labs Reviewed - No data to display   EKG   Radiology DG Chest 2 View Result Date: 06/24/2023 CLINICAL DATA:  38 year old male history of coughing congestion for 1 month. EXAM: CHEST - 2 VIEW COMPARISON:  Chest x-ray 10/03/2022. FINDINGS: Lung volumes are normal. No consolidative airspace disease. No pleural effusions. No pneumothorax. No pulmonary nodule or mass noted. Pulmonary vasculature and the cardiomediastinal silhouette are within normal limits. IMPRESSION: No radiographic evidence of acute cardiopulmonary disease. Electronically Signed   By: Toribio Aye M.D.   On: 06/24/2023 11:16    Procedures Procedures (including critical care time)  Medications Ordered in UC Medications - No data to display  Initial Impression / Assessment and Plan / UC Course  I have reviewed the triage vital signs and the nursing notes.  Pertinent labs & imaging results that were available during my care of the patient were reviewed by me and considered in my medical decision making (see chart for details).   38 year old male presents for 1 month history of cough and congestion. No fever or SOB. Improvement in his symptoms with over-the-counter DayQuil/NyQuil.    Vitals are normal and stable and he is overall well-appearing.  On exam he has nasal congestion, no posterior pharyngeal erythema. Few scattered rhonchi bilateral upper lung fields which clear with cough.  CXR obtained due to duration of symptoms. Will assess for pneumonia.  Independently reviewed chest x-ray.  CXR wet read is negative.  Reviewed this with patient.   Viral bronchitis.  Discussed that bronchitis symptoms can last for 2 to 6 weeks.  He has had improvement from onset.  Explained that if the radiologist sees pneumonia on his chest x-ray I will send antibiotics, otherwise he does not need them.   May benefit from a course of prednisone  and changing cough medication.  Encouraged supportive care with increasing rest and fluids.  Advised to return for fever, worsening cough, worsening pain in chest or shortness of breath.  X-ray over read negative.  Final Clinical Impressions(s) / UC Diagnoses   Final diagnoses:  Acute bronchitis, unspecified organism  Subacute cough  Chest tightness     Discharge Instructions      -  Your x-ray is normal.  You do not have pneumonia or fluid in your lungs. - You have a chest cold. - I sent a corticosteroid and cough medicine to the pharmacy.  As discussed, chest colds are generally viral and can last for 2 to 6 weeks.  It may take another week or 2 for your symptoms to resolve.  If you develop a fever, increased chest pain or shortness of breath please return for reevaluation.        ED Prescriptions     Medication Sig Dispense Auth. Provider   promethazine -dextromethorphan (PROMETHAZINE -DM) 6.25-15 MG/5ML syrup Take 5 mLs by mouth 4 (four) times daily as needed. 118 mL Arvis Huxley B, PA-C   predniSONE  (DELTASONE ) 10 MG tablet Take 6 tabs p.o. on day 1 and decrease by 1 tablet daily until complete 21 tablet Arvis Huxley NOVAK, PA-C      PDMP not reviewed this encounter.        Arvis Huxley NOVAK, PA-C 06/24/23 1131

## 2024-02-12 ENCOUNTER — Other Ambulatory Visit: Payer: Self-pay

## 2024-02-12 ENCOUNTER — Emergency Department

## 2024-02-12 ENCOUNTER — Encounter: Payer: Self-pay | Admitting: Emergency Medicine

## 2024-02-12 ENCOUNTER — Emergency Department
Admission: EM | Admit: 2024-02-12 | Discharge: 2024-02-12 | Disposition: A | Discharge status: Home / Self Care | Attending: Emergency Medicine | Admitting: Emergency Medicine

## 2024-02-12 DIAGNOSIS — R0789 Other chest pain: Secondary | ICD-10-CM | POA: Diagnosis present

## 2024-02-12 DIAGNOSIS — N179 Acute kidney failure, unspecified: Secondary | ICD-10-CM | POA: Diagnosis not present

## 2024-02-12 DIAGNOSIS — I309 Acute pericarditis, unspecified: Secondary | ICD-10-CM | POA: Diagnosis not present

## 2024-02-12 DIAGNOSIS — R0781 Pleurodynia: Secondary | ICD-10-CM

## 2024-02-12 LAB — CBC
HCT: 46.3 % (ref 39.0–52.0)
Hemoglobin: 15.7 g/dL (ref 13.0–17.0)
MCH: 29.5 pg (ref 26.0–34.0)
MCHC: 33.9 g/dL (ref 30.0–36.0)
MCV: 86.9 fL (ref 80.0–100.0)
Platelets: 280 K/uL (ref 150–400)
RBC: 5.33 MIL/uL (ref 4.22–5.81)
RDW: 12.7 % (ref 11.5–15.5)
WBC: 5.2 K/uL (ref 4.0–10.5)
nRBC: 0 % (ref 0.0–0.2)

## 2024-02-12 LAB — BASIC METABOLIC PANEL WITH GFR
Anion gap: 7 (ref 5–15)
BUN: 24 mg/dL — ABNORMAL HIGH (ref 6–20)
CO2: 22 mmol/L (ref 22–32)
Calcium: 8.9 mg/dL (ref 8.9–10.3)
Chloride: 108 mmol/L (ref 98–111)
Creatinine, Ser: 1.6 mg/dL — ABNORMAL HIGH (ref 0.61–1.24)
GFR, Estimated: 57 mL/min — ABNORMAL LOW (ref >60)
Glucose, Bld: 116 mg/dL — ABNORMAL HIGH (ref 70–99)
Potassium: 3.9 mmol/L (ref 3.5–5.1)
Sodium: 137 mmol/L (ref 135–145)

## 2024-02-12 LAB — TROPONIN I (HIGH SENSITIVITY)
Troponin I (High Sensitivity): 4 ng/L (ref <18)
Troponin I (High Sensitivity): 4 ng/L (ref <18)

## 2024-02-12 MED ORDER — NAPROXEN 500 MG PO TABS: 500.0000 mg | ORAL_TABLET | Freq: Two times a day (BID) | ORAL | 0 refills | Status: AC

## 2024-02-12 MED ORDER — KETOROLAC TROMETHAMINE 15 MG/ML IJ SOLN
15.0000 mg | Freq: Once | INTRAMUSCULAR | Status: AC
  Administered 2024-02-12: 15 mg via INTRAVENOUS
  Filled 2024-02-12: qty 1

## 2024-02-12 MED ORDER — SODIUM CHLORIDE 0.9 % IV BOLUS
1000.0000 mL | Freq: Once | INTRAVENOUS | Status: AC
  Administered 2024-02-12: 1000 mL via INTRAVENOUS

## 2024-02-12 NOTE — ED Provider Notes (Signed)
 [facility] Provider Note    Event Date/Time   First MD Initiated Contact with Patient 02/12/24 [phone]     (approximate)   History   Chest Pain   HPI  Marcus Woods is a 38 year old male presenting to the emergency department for evaluation of chest pain.  Patient reports that a few days ago he had some chest pain that resolved.  This morning, he woke up with chest pain primarily over his right side but radiating up to his left side.  Does report associated shortness of breath.  Pain is worse with deep breaths.  No fevers.  Reports he thinks he has had something similar in the past a few years ago, uncertain cause.  Does report upper respiratory symptoms a few weeks ago.     Physical Exam   Triage Vital Signs: ED Triage Vitals  Encounter Vitals Group     BP 02/12/24 0336 (!) 132/104     Girls Systolic BP Percentile --      Girls Diastolic BP Percentile --      Boys Systolic BP Percentile --      Boys Diastolic BP Percentile --      Pulse Rate 02/12/24 0336 78     Resp 02/12/24 0336 18     Temp 02/12/24 0336 97.9 F (36.6 C)     Temp Source 02/12/24 0336 Oral     SpO2 02/12/24 0332 99 %     Weight 02/12/24 0334 220 lb (99.8 kg)     Height 02/12/24 0334 5' 10 (1.778 m)     Head Circumference --      Peak Flow --      Pain Score 02/12/24 0333 6     Pain Loc --      Pain Education --      Exclude from Growth Chart --     Most recent vital signs: Vitals:   02/12/24 0336 02/12/24 0700  BP: (!) 132/104 (!) 128/94  Pulse: 78 62  Resp: 18 14  Temp: 97.9 F (36.6 C)   SpO2: 98% 98%     General: Awake, interactive  CV:  Regular rate, good peripheral perfusion.  Resp:  Unlabored respirations, lungs clear to auscultation  Chest wall:  Nontender to palpation  Abd:  Nondistended, soft, nontender Neuro:  Symmetric facial movement, fluid speech   ED Results / Procedures / Treatments   Labs (all labs ordered are listed, but only abnormal  results are displayed) Labs Reviewed  BASIC METABOLIC PANEL WITH GFR - Abnormal; Notable for the following components:      Result Value   Glucose, Bld 116 (*)    BUN 24 (*)    Creatinine, Ser 1.60 (*)    GFR, Estimated 57 (*)    All other components within normal limits  CBC  TROPONIN I (HIGH SENSITIVITY)  TROPONIN I (HIGH SENSITIVITY)     EKG EKG independently reviewed and interpreted by myself demonstrates:  EKG demonstrates normal sinus rhythm rate of 72, PR 154, QRS 84, QTc 385, subtle ST elevation noted in multiple leads with mild PR depression.  The morphology is similar to prior from 06/05/2022, though the PR depressions do seem to be potentially slightly more prominent  RADIOLOGY Imaging independently reviewed and interpreted by myself demonstrates:  CXR without focal consolidation  Formal Radiology Read:  DG Chest 2 View Result Date: 02/12/2024 EXAM: 2 VIEW(S) XRAY OF THE CHEST 02/12/2024 04:15:16 AM COMPARISON: 06/24/2023 CLINICAL HISTORY: CP. PER ER  NOTE; Pt arrives via EMS from home; to triage via w/c with no distress noted; st awoke with rt sided CP radiating to left side accomp by [facility] FINDINGS: LUNGS AND PLEURA: No focal pulmonary opacity. No pulmonary edema. No pleural effusion. No pneumothorax. HEART AND MEDIASTINUM: No acute abnormality of the cardiac and mediastinal silhouettes. BONES AND SOFT TISSUES: No acute osseous abnormality. IMPRESSION: 1. No acute process. Electronically signed by: Waddell Calk MD 02/12/2024 05:32 AM EDT RP Workstation: [id number]    PROCEDURES:  Critical Care performed: No  Procedures   MEDICATIONS ORDERED IN ED: Medications  sodium chloride  0.9 % bolus 1,000 mL (1,000 mLs Intravenous New Bag/Given 02/12/24 0711)  ketorolac  (TORADOL ) 15 MG/ML injection 15 mg (15 mg Intravenous Given 02/12/24 0711)     IMPRESSION / MDM / ASSESSMENT AND PLAN / ED COURSE  I reviewed the triage vital signs and the nursing notes.  Differential  diagnosis includes, but is not limited to, pneumonia, pneumothorax, low risk PE and PERC negative, lower suspicion ACS, consideration for pericarditis  Patient's presentation is most consistent with acute presentation with potential threat to life or bodily function.  38 year old male presenting to the emergency department for evaluation of chest pain.  Stable vitals on presentation.  Does report pleuritic pain.  EKG concerning for possible pericarditis, though largely similar to prior.  Difficult to say if this is patient's baseline EKG.  Initial troponin negative.  With recent onset of symptoms will obtain repeat troponin.  CBC without significant derangement.  BMP with mild AKI from recent labs from outside hospital, ordered for later IV fluids for possible component of dehydration.  Bedside ultrasound performed without evidence of pericardial effusion.  With pleuritic pain and recent viral symptoms, do think it is reasonable to treat patient as pericarditis.  If second troponin remains negative, suspect patient will be appropriate for discharge with course of NSAIDs with outpatient follow-up with cardiology.  Signed out to oncoming physician pending repeat troponin and disposition.  Clinical Course as of 02/12/24 9277  Tue Feb 12, 2024  0700 Pending second trop and if negative will dc with treatment for pericarditis.  Low risk PE and perc neg. J point elevation but ? STE/PR depression.  Starting NSAIDs, no effusion on bedside echo.  Plan to dc with follow up as outpatient.  [SM]    Clinical Course User Index [SM] Suzanne Kirsch, MD     FINAL CLINICAL IMPRESSION(S) / ED DIAGNOSES   Final diagnoses:  Acute pericarditis, unspecified type  Pleuritic chest pain     Rx / DC Orders   ED Discharge Orders          Ordered    Ambulatory referral to Cardiology       Comments: If you have not heard from the Cardiology office within the next 72 hours please call [phone].   02/12/24 0721     naproxen  (NAPROSYN ) 500 MG tablet  2 times daily with meals        02/12/24 9278             Note:  This document was prepared using Dragon voice recognition software and may include unintentional dictation errors.   Levander Slate, MD 02/12/24 [phone]

## 2024-02-12 NOTE — ED Provider Notes (Addendum)
 Care assumed of patient from outgoing provider.  See their note for initial history, exam and plan.  Clinical Course as of 02/12/24 0803  Tue Feb 12, 2024  0700 Pending second trop and if negative will dc with treatment for pericarditis.  Low risk PE and perc neg. J point elevation but ? STE/PR depression.  Starting NSAIDs, no effusion on bedside echo.  Plan to dc with follow up as outpatient.  [SM]    Clinical Course User Index [SM] Suzanne Kirsch, MD   Second troponin is negative.  Plan to discharge home with outpatient follow-up.  Discussed return precautions. Discussed NSAID and close follow up to repeat Cr. States will start drinking more water and call pcp today. Will return for worsening symptoms.    Suzanne Kirsch, MD 02/12/24 9195    Suzanne Kirsch, MD 02/12/24 [phone]

## 2024-02-12 NOTE — ED Triage Notes (Signed)
 Pt arrives via EMS from home; to triage via w/c with no distress noted; st awoke with rt sided CP radiating to left side accomp by [facility]

## 2024-02-12 NOTE — Discharge Instructions (Addendum)
 You are seen in the ER today for evaluation of your chest pain.  This may be related to inflammation of the lining around your heart known as pericarditis.  I sent a prescription for anti-inflammatory medication to your pharmacy.  Please take this directed.  Follow-up with cardiology for further evaluation.  Return to the ER for new or worsening symptoms.

## 2024-02-12 NOTE — ED Notes (Signed)
 Pt verbalizes understanding of discharge instructions. Opportunity for questioning and answers were provided. Pt discharged from ED to home.   ? ?

## 2024-02-14 NOTE — Progress Notes (Unsigned)
 cardiology Office Note  Date:  02/15/2024   ID:  Marcus Woods, DOB 11-Jan-1986, MRN [id number]  PCP:  System, Provider Not In   Chief Complaint  Patient presents with   New Patient (Initial Visit)    Ref by Dr. Nilsa Dade for chest pain. Patient c/o chest pain, felt like a pause in my chest and shortness of breath.     HPI:  Marcus Coxis a 38 y.o. malewith past medical history of: Chest pain Acute renal failure Who presents by referral from Dr. Maury for consultation of chest pain   Works in [employer] often does not get to drink much water  Last weekend, August 23 and 24, lifting heavy garbage cans, 2 days later with acute onset right side chest pain radiating to mediastinum, tender on palpation, tender to movement and deep inspiration  In the emergency room February 12, 2024 chest pain chest pain Does report associated shortness of breath. Pain is worse with deep breaths.  Started on NSAIDs and steroids, had a bedside echo Was told he might have pericarditis  EKG reviewed, repolarization abnormality noted  Labs reviewed Creatinine 1.6, acute change  Wakes at night, chronic issue, occasional palpitation Snores Not tired in the day  EKG personally reviewed by myself on todays visit EKG Interpretation Date/Time:  Friday February 15 2024 08:53:30 EDT Ventricular Rate:  71 PR Interval:  154 QRS Duration:  84 QT Interval:  366 QTC Calculation: 397 R Axis:   10  Text Interpretation: Normal sinus rhythm Early repolarization Normal ECG When compared with ECG of 12-Feb-2024 03:38, No significant change was found Confirmed by Perla Lye [phone]) on 02/15/2024 9:08:52 AM    PMH:   has no past medical history on file.  PSH:    Past Surgical History:  Procedure Laterality Date   NO PAST SURGERIES      Current Outpatient Medications  Medication Sig Dispense Refill   naproxen  (NAPROSYN ) 500 MG tablet Take 1 tablet (500 mg total) by mouth 2 (two) times daily  with a meal for 7 days. 14 tablet 0   predniSONE  (DELTASONE ) 10 MG tablet Take 6 tabs p.o. on day 1 and decrease by 1 tablet daily until complete 21 tablet 0   promethazine -dextromethorphan (PROMETHAZINE -DM) 6.25-15 MG/5ML syrup Take 5 mLs by mouth 4 (four) times daily as needed. 118 mL 0   No current facility-administered medications for this visit.     Allergies:   Patient has no known allergies.   Social History:  The patient  reports that he has quit smoking. He has never used smokeless tobacco. He reports that he does not currently use alcohol. He reports that he does not currently use drugs after having used the following drugs: Marijuana.   Family History:   family history includes Hyperlipidemia in his mother; Hypertension in his mother.    Review of Systems: Review of Systems  Constitutional: Negative.   HENT: Negative.    Respiratory: Negative.    Cardiovascular: Negative.   Gastrointestinal: Negative.   Musculoskeletal: Negative.   Neurological: Negative.   Psychiatric/Behavioral: Negative.    All other systems reviewed and are negative.   PHYSICAL EXAM: VS:  BP 108/78 (BP Location: Right Arm, Patient Position: Sitting, Cuff Size: Normal)   Pulse 71   Ht 5' 11 (1.803 m)   Wt 221 lb 6 oz (100.4 kg)   SpO2 96%   BMI 30.88 kg/m  , BMI Body mass index is 30.88 kg/m. GEN: Well nourished, well developed,  in no acute distress HEENT: normal Neck: no JVD, carotid bruits, or masses Cardiac: RRR; no murmurs, rubs, or gallops,no edema  Respiratory:  clear to auscultation bilaterally, normal work of breathing GI: soft, nontender, nondistended, + BS MS: no deformity or atrophy Skin: warm and dry, no rash Neuro:  Strength and sensation are intact Psych: euthymic mood, full affect    Recent Labs: 02/12/2024: BUN 24; Creatinine, Ser 1.60; Hemoglobin 15.7; Platelets 280; Potassium 3.9; Sodium 137    Lipid Panel No results found for: CHOL, HDL, LDLCALC, TRIG     Wt Readings from Last 3 Encounters:  02/15/24 221 lb 6 oz (100.4 kg)  02/12/24 220 lb (99.8 kg)  06/24/23 210 lb 15.7 oz (95.7 kg)       ASSESSMENT AND PLAN:  Problem List Items Addressed This Visit   None Visit Diagnoses       Idiopathic pericarditis, unspecified chronicity    -  Primary   Relevant Orders   EKG 12-Lead (Completed)      Atypical chest pain EKG abnormality consistent with repolarization abnormality less likely pericarditis Symptoms essentially resolved on today's visit More likely chest wall pain from lifting heavy garbage cans last weekend Recommend he take NSAIDs as needed, okay to complete short course of prednisone  provided by the ER No further cardiac workup needed  Sleep disorder Recommend he talk with primary care whether he needs sleep study  Seen in consultation for Dr. Levander and will be referred back to her office for ongoing care of the issues detailed above  Signed, Velinda Lunger, M.D., Ph.D. [facility] [place], [place] [phone]

## 2024-02-15 ENCOUNTER — Ambulatory Visit: Attending: Cardiovascular Disease | Admitting: Cardiovascular Disease

## 2024-02-15 ENCOUNTER — Encounter: Payer: Self-pay | Admitting: Cardiovascular Disease

## 2024-02-15 ENCOUNTER — Encounter: Payer: Self-pay | Admitting: Emergency Medicine

## 2024-02-15 VITALS — BP 108/78 | HR 71 | Ht 71.0 in | Wt 221.4 lb

## 2024-02-15 DIAGNOSIS — R079 Chest pain, unspecified: Secondary | ICD-10-CM

## 2024-02-15 DIAGNOSIS — I300 Acute nonspecific idiopathic pericarditis: Secondary | ICD-10-CM

## 2024-02-15 NOTE — Patient Instructions (Addendum)

## 2024-05-20 ENCOUNTER — Encounter: Payer: Self-pay | Admitting: Emergency Medicine

## 2024-05-20 ENCOUNTER — Ambulatory Visit
Admission: EM | Admit: 2024-05-20 | Discharge: 2024-05-20 | Disposition: A | Discharge status: Home / Self Care | Attending: Physician Assistant | Admitting: Physician Assistant

## 2024-05-20 DIAGNOSIS — J069 Acute upper respiratory infection, unspecified: Secondary | ICD-10-CM

## 2024-05-20 DIAGNOSIS — R051 Acute cough: Secondary | ICD-10-CM | POA: Diagnosis not present

## 2024-05-20 LAB — POC SOFIA SARS ANTIGEN FIA: SARS Coronavirus 2 Ag: NEGATIVE

## 2024-05-20 MED ORDER — PROMETHAZINE-DM 6.25-15 MG/5ML PO SYRP: 5.0000 mL | ORAL_SOLUTION | Freq: Four times a day (QID) | ORAL | 0 refills | Status: DC | PRN

## 2024-05-20 MED ORDER — IPRATROPIUM BROMIDE 0.06 % NA SOLN: 2.0000 | Freq: Four times a day (QID) | NASAL | 0 refills | Status: AC

## 2024-05-20 NOTE — ED Provider Notes (Signed)
 [facility]    CSN: [id number] Arrival date & time: 05/20/24  1657      History   Chief Complaint Chief Complaint  Patient presents with   Cough   Nasal Congestion    HPI Marcus Woods is a 38 y.o. male presenting onset of runny nose/nasal congestion, cough and fatigue today.  He denies any fevers, body aches, sore throat, headaches, chest pain, breathing difficulty, abdominal pain, nausea/vomiting or diarrhea.  No sick contacts and no known exposure to flu/COVID-19.  He has not taken over-the-counter meds for symptoms.  He does not report any other complaints or concerns.  HPI  History reviewed. No pertinent past medical history.  Patient Active Problem List   Diagnosis Date Noted   Muscle strain 03/26/2023   Muscle spasm of back 03/26/2023   Cervical pain (neck) 03/26/2023   Torticollis, acute 03/26/2023    Past Surgical History:  Procedure Laterality Date   NO PAST SURGERIES         Home Medications    Prior to Admission medications   Medication Sig Start Date End Date Taking? Authorizing Provider  promethazine -dextromethorphan (PROMETHAZINE -DM) 6.25-15 MG/5ML syrup Take 5 mLs by mouth 4 (four) times daily as needed. 05/20/24  Yes Arvis Jolan NOVAK, PA-C    Family History Family History  Problem Relation Age of Onset   Hyperlipidemia Mother    Hypertension Mother     Social History Social History   Tobacco Use   Smoking status: Former   Smokeless tobacco: Never  [employer]vertising [employer]   Vaping status: Never Used  Substance Use Topics   Alcohol use: Not Currently   Drug use: Not Currently    Types: Marijuana     Allergies   Patient has no known allergies.   Review of Systems Review of Systems  Constitutional:  Positive for fatigue. Negative for fever.  HENT:  Positive for congestion and rhinorrhea. Negative for sinus pressure, sinus pain and sore throat.   Respiratory:  Positive for cough. Negative for shortness of breath.   Cardiovascular:   Negative for chest pain.  Gastrointestinal:  Negative for abdominal pain, diarrhea, nausea and vomiting.  Musculoskeletal:  Negative for myalgias.  Neurological:  Negative for weakness, light-headedness and headaches.  Hematological:  Negative for adenopathy.     Physical Exam   No data found.  Updated Vital Signs BP 127/85 (BP Location: Left Arm)   Pulse 96   Temp 98.3 F (36.8 C) (Oral)   Resp 18   Wt 225 lb (102.1 kg)   SpO2 96%   BMI 31.38 kg/m       Physical Exam Vitals and nursing note reviewed.  Constitutional:      General: He is not in acute distress.    Appearance: Normal appearance. He is well-developed and normal weight. He is not ill-appearing.  HENT:     Head: Normocephalic and atraumatic.     Nose: Congestion and rhinorrhea present.     Mouth/Throat:     Mouth: Mucous membranes are moist.     Pharynx: Oropharynx is clear.  Eyes:     General: No scleral icterus.    Conjunctiva/sclera: Conjunctivae normal.  Cardiovascular:     Rate and Rhythm: Normal rate and regular rhythm.     Heart sounds: Normal heart sounds.  Pulmonary:     Effort: Pulmonary effort is normal. No respiratory distress.     Breath sounds: Normal breath sounds.  Musculoskeletal:     Cervical back: Neck supple.  Skin:    General: Skin is warm and dry.  Neurological:     General: No focal deficit present.     Mental Status: He is alert. Mental status is at baseline.     Motor: No weakness.     Gait: Gait normal.  Psychiatric:        Mood and Affect: Mood normal.        Behavior: Behavior normal.      UC Treatments / Results  Labs (all labs ordered are listed, but only abnormal results are displayed) Labs Reviewed  POC SOFIA SARS ANTIGEN FIA    EKG   Radiology No results found.  Procedures Procedures (including critical care time)  Medications Ordered in UC Medications - No data to display  Initial Impression / Assessment and Plan / UC Course  I have  reviewed the triage vital signs and the nursing notes.  Pertinent labs & imaging results that were available during my care of the patient were reviewed by me and considered in my medical decision making (see chart for details).   38 year old male presenting for cough, congestion and fatigue today. No fever, sore throat or SOB.  Vital signs are all stable.  He is afebrile.  He is overall well-appearing and does have a lot of nasal congestion and rhinorrhea on exam.  His chest is clear auscultation and heart regular rate and rhythm.  Covid negative.   Viral illness. Advised supportive care with increasing rest and fluids.  I have sent Promethazine -DM and Atrovent  nasal spray.  Encouraged him to follow back up with us  if he develops any worsening symptoms or not better and the next couple weeks.  He reports a history of bronchitis and says he is often sick for weeks or an entire month with cough.  He asked for me to put a refill on the promethazine  cough medication.  I advised patient to give us  a call if he runs out of the medication after the next 7 to 10 days and we can consider sending another prescription.  He is advised to return if he develops a fever, worsening cough or breathing problem.  Work note given.   Final Clinical Impressions(s) / UC Diagnoses   Final diagnoses:  Viral upper respiratory tract infection  Acute cough     Discharge Instructions      URI/COLD SYMPTOMS: Your exam today is consistent with a viral illness. Antibiotics are not indicated at this time. Use medications as directed, including cough syrup, nasal saline, and decongestants. Your symptoms should improve over the next few days and resolve within 7-10 days. Increase rest and fluids. F/u if symptoms worsen or predominate such as sore throat, ear pain, productive cough, shortness of breath, or if you develop high fevers or worsening fatigue over the next several days.       ED Prescriptions     Medication  Sig Dispense Auth. Provider   promethazine -dextromethorphan (PROMETHAZINE -DM) 6.25-15 MG/5ML syrup Take 5 mLs by mouth 4 (four) times daily as needed. 118 mL Arvis Jolan NOVAK, PA-C      PDMP not reviewed this encounter.      Arvis Jolan NOVAK, PA-C 05/20/24 [phone]

## 2024-05-20 NOTE — Discharge Instructions (Signed)

## 2024-05-20 NOTE — ED Triage Notes (Signed)
 Pt presents with a dry cough and nasal congestion started today. Pt has not taken anything for his symptoms.

## 2024-05-25 ENCOUNTER — Ambulatory Visit

## 2024-05-25 ENCOUNTER — Ambulatory Visit
Admission: EM | Admit: 2024-05-25 | Discharge: 2024-05-25 | Disposition: A | Discharge status: Home / Self Care | Attending: Emergency Medicine | Admitting: Emergency Medicine

## 2024-05-25 DIAGNOSIS — R051 Acute cough: Secondary | ICD-10-CM

## 2024-05-25 DIAGNOSIS — J22 Unspecified acute lower respiratory infection: Secondary | ICD-10-CM | POA: Diagnosis not present

## 2024-05-25 MED ORDER — ALBUTEROL SULFATE HFA 108 (90 BASE) MCG/ACT IN AERS: 2.0000 | INHALATION_SPRAY | RESPIRATORY_TRACT | 0 refills | Status: AC | PRN

## 2024-05-25 MED ORDER — AEROCHAMBER MV MISC: 1 refills | Status: AC

## 2024-05-25 MED ORDER — DOXYCYCLINE HYCLATE 100 MG PO CAPS: 100.0000 mg | ORAL_CAPSULE | Freq: Two times a day (BID) | ORAL | 0 refills | Status: AC

## 2024-05-25 MED ORDER — PREDNISONE 50 MG PO TABS: 50.0000 mg | ORAL_TABLET | Freq: Every day | ORAL | 0 refills | Status: AC

## 2024-05-25 MED ORDER — HYDROCOD POLI-CHLORPHE POLI ER 10-8 MG/5ML PO SUER: 5.0000 mL | Freq: Two times a day (BID) | ORAL | 0 refills | Status: AC | PRN

## 2024-05-25 NOTE — Discharge Instructions (Signed)
 Take two puffs from your albuterol  inhaler with your spacer every 4 hours for 2 days, then every 6 hours for 2 days, then as needed. You can back off if you start to improve  sooner. Finish the steroids unless your doctor tells you to stop. Finish the antibiotics, even if you feel better.  Take tylenol  1 gram combined with  600 mg of motrin  up to 3-4 times a day as needed for pain.  Tussionex for cough.  Make sure you drink extra fluids. Return to the ER if you get worse, have a fever >100.4, or any other concerns.   If the spacer is too expensive at the pharmacy, you can get an AeroChamber Z-Stat off of [employer] for about $10-$15.  Go to www.goodrx.com  or www.costplusdrugs.com to look up your medications. This will give you a list of where you can find your prescriptions at the most affordable prices. Or ask the pharmacist what the cash price is, or if they have any other discount programs available to help make your medication more affordable. This can be less expensive than what you would pay with insurance.

## 2024-05-25 NOTE — ED Provider Notes (Signed)
 HPI  SUBJECTIVE:  Marcus Woods is a 38 y.o. male who presents with 5 days of a cough productive of mucus, headache, chest soreness from coughing, wheezing, dyspnea on exertion.  Reports shortness of breath present with coughing only.  No fevers, body aches, nasal congestion or rhinorrhea, sinus pain or pressure, postnasal drip, hemoptysis.  He is unable to sleep at night because of the cough.  No antibiotics in the past 3 months.  No antipyretic in the past 6 hours.  He was here 2 days ago, found to have a URI, sent home with Promethazine  DM, he states the cough is getting worse.  He has tried Promethazine  DM with temporary relief.  Symptoms are worse with coughing.  No GERD symptoms.  Past medical history negative for pulmonary disease, smoking, diabetes.  He has a history of GERD.  PCP: [facility]    History reviewed. No pertinent past medical history.  Past Surgical History:  Procedure Laterality Date   NO PAST SURGERIES      Family History  Problem Relation Age of Onset   Hyperlipidemia Mother    Hypertension Mother     Social History   Tobacco Use   Smoking status: Former   Smokeless tobacco: Never  Vaping Use   Vaping status: Never Used  Substance Use Topics   Alcohol use: Not Currently   Drug use: Not Currently    Types: Marijuana    No current facility-administered medications for this encounter.  Current Outpatient Medications:    albuterol  (VENTOLIN  HFA) 108 (90 Base) MCG/ACT inhaler, Inhale 2 puffs into the lungs every 4 (four) hours as needed for wheezing or shortness of breath., Disp: 1 each, Rfl: 0   chlorpheniramine-HYDROcodone  (TUSSIONEX) 10-8 MG/5ML, Take 5 mLs by mouth every 12 (twelve) hours as needed for cough., Disp: 60 mL, Rfl: 0   doxycycline  (VIBRAMYCIN ) 100 MG capsule, Take 1 capsule (100 mg total) by mouth 2 (two) times daily for 5 days., Disp: 10 capsule, Rfl: 0   predniSONE  (DELTASONE ) 50 MG tablet, Take 1 tablet (50 mg total) by mouth daily  with breakfast., Disp: 5 tablet, Rfl: 0   Spacer/Aero-Holding Chambers (AEROCHAMBER MV) inhaler, Use as instructed, Disp: 1 each, Rfl: 1   ipratropium (ATROVENT ) 0.06 % nasal spray, Place 2 sprays into both nostrils 4 (four) times daily., Disp: 15 mL, Rfl: 0  No Known Allergies   ROS  As noted in HPI.   Physical Exam  BP 134/87 (BP Location: Left Arm)   Pulse 78   Temp 98.5 F (36.9 C) (Oral)   SpO2 99%   Constitutional: Well developed, well nourished, extensive coughing. Eyes: PERRL, EOMI, conjunctiva normal bilaterally HENT: Normocephalic, atraumatic,mucus membranes moist.  Positive nasal congestion right side.  Swollen erythematous turbinates right side . no maxillary, frontal sinus tenderness.  No postnasal drip. Respiratory: Clear to auscultation bilaterally, no rales, no wheezing, no rhonchi.  Fair air movement.  Positive lateral chest wall tenderness Cardiovascular: Normal rate and rhythm, no murmurs, no gallops, no rubs GI: nondistended skin: No rash, skin intact Musculoskeletal: no deformities Neurologic: Alert & oriented x 3, CN III-XII grossly intact, no motor deficits, sensation grossly intact Psychiatric: Speech and behavior appropriate   ED Course   Medications - No data to display  Orders Placed This Encounter  Procedures   DG Chest 2 View    Standing Status:   Standing    Number of Occurrences:   1    Reason for Exam (SYMPTOM  OR  DIAGNOSIS REQUIRED):   Cough for week rule out pneumonia   No results found for this or any previous visit (from the past 24 hours). DG Chest 2 View Result Date: 05/25/2024 EXAM: 2 VIEW(S) XRAY OF THE CHEST 05/25/2024 11:49:00 AM COMPARISON: 02/12/2024 CLINICAL HISTORY: Cough for week rule out pneumonia FINDINGS: LUNGS AND PLEURA: No focal pulmonary opacity. No pleural effusion. No pneumothorax. HEART AND MEDIASTINUM: No acute abnormality of the cardiac and mediastinal silhouettes. BONES AND SOFT TISSUES: No acute osseous  abnormality. IMPRESSION: 1. No acute process. Electronically signed by: Evalene Coho MD 05/25/2024 12:20 PM EST RP Workstation: [id number]    ED Clinical Impression  1. Lower respiratory tract infection   2. Acute cough      ED Assessment/Plan      [name], and feel that the risk/benefit ratio today is favorable for proceeding with a prescription for controlled substance.  No opiate prescriptions in the past 2 years  Reviewed imaging independently.  Questionable infiltrate right lower lobe new compared to chest x-ray from February 12, 2024 as read by me.  Radiology read negative for any acute cardiopulmonary process.  See radiology report for full details.  Discussed imaging with patient while in department.  Concern for lower respiratory tract infection/early pneumonia. sending home with Tussionex, recommends [facility] albuterol  inhaler with a spacer for 4 days, then as needed thereafter, prednisone  2 mg for 5 days, Tussionex, and doxycycline  100 mg p.o. twice daily for 5 days.  Work note for several days.  Discussed imaging, MDM, treatment plan, and plan for follow-up with patient Discussed sn/sx that should prompt return to the ED. patient agrees with plan.   Meds ordered this encounter  Medications   albuterol  (VENTOLIN  HFA) 108 (90 Base) MCG/ACT inhaler    Sig: Inhale 2 puffs into the lungs every 4 (four) hours as needed for wheezing or shortness of breath.    Dispense:  1 each    Refill:  0   predniSONE  (DELTASONE ) 50 MG tablet    Sig: Take 1 tablet (50 mg total) by mouth daily with breakfast.    Dispense:  5 tablet    Refill:  0   Spacer/Aero-Holding Chambers (AEROCHAMBER MV) inhaler    Sig: Use as instructed    Dispense:  1 each    Refill:  1   chlorpheniramine-HYDROcodone  (TUSSIONEX) 10-8 MG/5ML    Sig: Take 5 mLs by mouth every 12 (twelve) hours as needed for cough.    Dispense:  60 mL    Refill:  0   doxycycline  (VIBRAMYCIN ) 100  MG capsule    Sig: Take 1 capsule (100 mg total) by mouth 2 (two) times daily for 5 days.    Dispense:  10 capsule    Refill:  0      *This clinic note was created using Scientist, clinical (histocompatibility and immunogenetics). Therefore, there may be occasional mistakes despite careful proofreading. ?    Van Knee, MD 05/26/24 1145

## 2024-05-25 NOTE — ED Triage Notes (Signed)
 Pt c/o cough, congestion x1week  Pt states that he was sick and seen on 05/20/24 but was sent home from work recently for continued symptoms  Pt states that when he coughs, he has pain in his chest and head.  Pt is worried about bronchitis
